# Patient Record
Sex: Male | Born: 1950 | Race: Black or African American | Hispanic: No | Marital: Married | Smoking: Never smoker
Health system: Southern US, Community
[De-identification: ages and names within clinical notes are randomized; demographics above are authoritative.]

## PROBLEM LIST (undated history)

## (undated) DIAGNOSIS — Z789 Other specified health status: Secondary | ICD-10-CM

## (undated) DIAGNOSIS — G4730 Sleep apnea, unspecified: Secondary | ICD-10-CM

## (undated) DIAGNOSIS — I10 Essential (primary) hypertension: Secondary | ICD-10-CM

## (undated) HISTORY — PX: SHOULDER ARTHROSCOPY WITH OPEN ROTATOR CUFF REPAIR: SHX6092

---

## 2003-07-06 HISTORY — PX: COLONOSCOPY: SHX174

## 2003-07-21 ENCOUNTER — Ambulatory Visit (HOSPITAL_COMMUNITY): Admission: RE | Admit: 2003-07-21 | Discharge: 2003-07-21 | Payer: Self-pay | Admitting: Internal Medicine

## 2004-07-24 ENCOUNTER — Ambulatory Visit (HOSPITAL_COMMUNITY): Admission: RE | Admit: 2004-07-24 | Discharge: 2004-07-24 | Payer: Self-pay | Admitting: Family Medicine

## 2005-11-13 ENCOUNTER — Ambulatory Visit (HOSPITAL_COMMUNITY): Admission: RE | Admit: 2005-11-13 | Discharge: 2005-11-13 | Payer: Self-pay | Admitting: Family Medicine

## 2006-05-11 ENCOUNTER — Emergency Department (HOSPITAL_COMMUNITY): Admission: EM | Admit: 2006-05-11 | Discharge: 2006-05-11 | Payer: Self-pay | Admitting: Emergency Medicine

## 2013-09-30 ENCOUNTER — Telehealth: Payer: Self-pay

## 2013-09-30 NOTE — Telephone Encounter (Signed)
Pt received a letter to set up [facility]CS. His call back number is [phone]

## 2013-09-30 NOTE — Telephone Encounter (Signed)
Pt was on RECALL for next colonoscopy. His last one was done 07/21/2003 by Dr. Gala Romney.   I returned call and could not reach pt on any of the phone numbers.   I called [phone] per Ginger and got someone's vm and lmom for a return call.

## 2013-10-01 NOTE — Telephone Encounter (Signed)
LATE ENTRY: Pt returned call yesterday afternoon. He has some hemorrhoids also, and is scheduled an OV appt with Neil Crouch, PA on 10/28/2013 at 9:00 AM.

## 2013-10-26 ENCOUNTER — Telehealth: Payer: Self-pay | Admitting: *Deleted

## 2013-10-26 NOTE — Telephone Encounter (Signed)
Pt called to schedule a colonoscopy. Please advise [phone]

## 2013-10-26 NOTE — Telephone Encounter (Signed)
Pt is aware he was already scheduled for OV on 10/28/2013 at 9:00 Am due to hemorrhoids.  He just wanted to know if he needed to prep for the OV appt. I told him just show up and when they schedule his colonoscopy he will be given his instructions.

## 2013-10-28 ENCOUNTER — Encounter: Payer: Self-pay | Admitting: Gastroenterology

## 2013-10-28 ENCOUNTER — Other Ambulatory Visit: Payer: Self-pay | Admitting: Internal Medicine

## 2013-10-28 ENCOUNTER — Encounter (INDEPENDENT_AMBULATORY_CARE_PROVIDER_SITE_OTHER): Payer: Self-pay

## 2013-10-28 ENCOUNTER — Ambulatory Visit (INDEPENDENT_AMBULATORY_CARE_PROVIDER_SITE_OTHER): Payer: Managed Care, Other (non HMO) | Admitting: Gastroenterology

## 2013-10-28 VITALS — BP 153/89 | HR 62 | Temp 98.6°F | Resp 20 | Ht 67.0 in | Wt 213.0 lb

## 2013-10-28 DIAGNOSIS — Z83719 Family history of colon polyps, unspecified: Secondary | ICD-10-CM

## 2013-10-28 DIAGNOSIS — Z8371 Family history of colonic polyps: Secondary | ICD-10-CM

## 2013-10-28 DIAGNOSIS — K649 Unspecified hemorrhoids: Secondary | ICD-10-CM

## 2013-10-28 MED ORDER — PEG 3350-KCL-NA BICARB-NACL 420 G PO SOLR: 4000.0000 mL | ORAL | Status: DC

## 2013-10-28 NOTE — Progress Notes (Signed)
cc'd to pcp 

## 2013-10-28 NOTE — Patient Instructions (Signed)
1. Colonoscopy with Dr. Rourk. Please see separate instructions. 

## 2013-10-28 NOTE — Progress Notes (Signed)
Primary Care Physician:  Leonides Grills, MD  Primary Gastroenterologist:  Garfield Cornea, MD   Chief Complaint  Patient presents with  . Hemorrhoids  . Colonoscopy    HPI:  Kevin Rose is a 63 y.o. male here to schedule colonoscopy. Last one in 2005, friable anal canal hemorrhoids. Occasionally has to use PrepH. Denies brbpr, melena, constipation, diarrhea, abdominal pain, heartburn, vomiting. Dysphagia. Weight lifter by hobby. Sometimes hemorrhoids become irritated.    No current outpatient prescriptions on file.   No current facility-administered medications for this visit.    Allergies as of 10/28/2013  . (Not on File)    History reviewed. No pertinent past medical history.  Past Surgical History  Procedure Laterality Date  . Shoulder arthroscopy with open rotator cuff repair Right   . Colonoscopy  07/2003    friable anal canal.    Family History  Problem Relation Age of Onset  . Colon polyps Mother   . Prostate cancer Father   . Lung cancer Brother   . Colon cancer Neg Hx     History   Social History  . Marital Status: Married    Spouse Name: N/A    Number of Children: 2  . Years of Education: N/A   Occupational History  . [employer]    Social History Main Topics  . Smoking status: Never Smoker   . Smokeless tobacco: Not on file  . Alcohol Use: No  . Drug Use: Not on file  . Sexual Activity: Not on file   Other Topics Concern  . Not on file   Social History Narrative  . No narrative on file      ROS:  General: Negative for anorexia, weight loss, fever, chills, fatigue, weakness. Eyes: Negative for vision changes.  ENT: Negative for hoarseness, difficulty swallowing , nasal congestion. CV: Negative for chest pain, angina, palpitations, dyspnea on exertion, peripheral edema.  Respiratory: Negative for dyspnea at rest, dyspnea on exertion, cough, sputum, wheezing.  GI: See history of present illness. GU:  Negative for dysuria,  hematuria, urinary incontinence, urinary frequency, nocturnal urination.  MS: Negative for joint pain, low back pain.  Derm: Negative for rash or itching.  Neuro: Negative for weakness, abnormal sensation, seizure, frequent headaches, memory loss, confusion.  Psych: Negative for anxiety, depression, suicidal ideation, hallucinations.  Endo: Negative for unusual weight change.  Heme: Negative for bruising or bleeding. Allergy: Negative for rash or hives.    Physical Examination:  BP 153/89  Pulse 62  Temp(Src) 98.6 F (37 C) (Oral)  Resp 20  Ht 5\' 7"  (1.702 m)  Wt 213 lb (96.616 kg)  BMI 33.35 kg/m2   General: Well-nourished, well-developed in no acute distress.  Head: Normocephalic, atraumatic.   Eyes: Conjunctiva pink, no icterus. Mouth: Oropharyngeal mucosa moist and pink , no lesions erythema or exudate. Neck: Supple without thyromegaly, masses, or lymphadenopathy.  Lungs: Clear to auscultation bilaterally.  Heart: Regular rate and rhythm, no murmurs rubs or gallops.  Abdomen: Bowel sounds are normal, nontender, nondistended, no hepatosplenomegaly or masses, no abdominal bruits or    hernia , no rebound or guarding.   Rectal: deferred Extremities: No lower extremity edema. No clubbing or deformities.  Neuro: Alert and oriented x 4 , grossly normal neurologically.  Skin: Warm and dry, no rash or jaundice.   Psych: Alert and cooperative, normal mood and affect.

## 2013-10-28 NOTE — Assessment & Plan Note (Signed)
Due for colonoscopy. Occasional irritation from hemorrhoids but no other GI complaints. Discussed CRH banding with patient today. He may be interested at a later time. Proceed with colonoscopy now.  I have discussed the risks, alternatives, benefits with regards to but not limited to the risk of reaction to medication, bleeding, infection, perforation and the patient is agreeable to proceed. Written consent to be obtained.

## 2013-10-29 ENCOUNTER — Telehealth: Payer: Self-pay | Admitting: *Deleted

## 2013-10-29 ENCOUNTER — Telehealth: Payer: Self-pay | Admitting: Internal Medicine

## 2013-10-29 NOTE — Telephone Encounter (Signed)
DONE

## 2013-10-29 NOTE — Telephone Encounter (Signed)
Pt called wanting to reschedule his colonoscopy that is scheduled for 11/24/13 with Dr. Gala Romney. Please advise [phone]

## 2013-10-29 NOTE — Telephone Encounter (Signed)
Pt was seen yesterday and needs to [facility] his procedure date. Please call [phone]

## 2013-10-29 NOTE — Telephone Encounter (Signed)
R/S Mr. Pringle to 07/16

## 2013-11-05 ENCOUNTER — Encounter (HOSPITAL_COMMUNITY): Payer: Self-pay | Admitting: Pharmacy Technician

## 2013-11-19 ENCOUNTER — Encounter (HOSPITAL_COMMUNITY)
Admission: RE | Disposition: A | Discharge status: Home / Self Care | Payer: Self-pay | Source: Ambulatory Visit | Attending: Internal Medicine

## 2013-11-19 ENCOUNTER — Ambulatory Visit (HOSPITAL_COMMUNITY)
Admission: RE | Admit: 2013-11-19 | Discharge: 2013-11-19 | Disposition: A | Discharge status: Home / Self Care | Payer: BC Managed Care – PPO | Source: Ambulatory Visit | Attending: Internal Medicine | Admitting: Internal Medicine

## 2013-11-19 ENCOUNTER — Encounter (HOSPITAL_COMMUNITY): Payer: Self-pay | Admitting: *Deleted

## 2013-11-19 DIAGNOSIS — Z1211 Encounter for screening for malignant neoplasm of colon: Secondary | ICD-10-CM

## 2013-11-19 DIAGNOSIS — Z8042 Family history of malignant neoplasm of prostate: Secondary | ICD-10-CM | POA: Diagnosis not present

## 2013-11-19 DIAGNOSIS — D126 Benign neoplasm of colon, unspecified: Secondary | ICD-10-CM | POA: Diagnosis not present

## 2013-11-19 DIAGNOSIS — K649 Unspecified hemorrhoids: Secondary | ICD-10-CM

## 2013-11-19 DIAGNOSIS — K648 Other hemorrhoids: Secondary | ICD-10-CM | POA: Insufficient documentation

## 2013-11-19 DIAGNOSIS — Z8371 Family history of colonic polyps: Secondary | ICD-10-CM | POA: Diagnosis not present

## 2013-11-19 DIAGNOSIS — F17200 Nicotine dependence, unspecified, uncomplicated: Secondary | ICD-10-CM | POA: Diagnosis not present

## 2013-11-19 DIAGNOSIS — Z83719 Family history of colon polyps, unspecified: Secondary | ICD-10-CM | POA: Insufficient documentation

## 2013-11-19 HISTORY — DX: Other specified health status: Z78.9

## 2013-11-19 HISTORY — PX: COLONOSCOPY: SHX5424

## 2013-11-19 SURGERY — COLONOSCOPY
Anesthesia: Moderate Sedation

## 2013-11-19 MED ORDER — SODIUM CHLORIDE 0.9 % IV SOLN
INTRAVENOUS | Status: DC
  Administered 2013-11-19: 11:00:00 via INTRAVENOUS

## 2013-11-19 MED ORDER — ONDANSETRON HCL 4 MG/2ML IJ SOLN
INTRAMUSCULAR | Status: AC
  Filled 2013-11-19: qty 2

## 2013-11-19 MED ORDER — MEPERIDINE HCL 100 MG/ML IJ SOLN
INTRAMUSCULAR | Status: AC
  Filled 2013-11-19: qty 2

## 2013-11-19 MED ORDER — ONDANSETRON HCL 4 MG/2ML IJ SOLN
INTRAMUSCULAR | Status: DC | PRN
  Administered 2013-11-19: 4 mg via INTRAVENOUS

## 2013-11-19 MED ORDER — MIDAZOLAM HCL 5 MG/5ML IJ SOLN
INTRAMUSCULAR | Status: DC | PRN
  Administered 2013-11-19: 1 mg via INTRAVENOUS
  Administered 2013-11-19 (×2): 2 mg via INTRAVENOUS

## 2013-11-19 MED ORDER — STERILE WATER FOR IRRIGATION IR SOLN
Status: DC | PRN
  Administered 2013-11-19: 11:00:00

## 2013-11-19 MED ORDER — MEPERIDINE HCL 100 MG/ML IJ SOLN
INTRAMUSCULAR | Status: DC | PRN
  Administered 2013-11-19: 50 mg via INTRAVENOUS
  Administered 2013-11-19: 25 mg via INTRAVENOUS

## 2013-11-19 MED ORDER — MIDAZOLAM HCL 5 MG/5ML IJ SOLN
INTRAMUSCULAR | Status: AC
  Filled 2013-11-19: qty 10

## 2013-11-19 NOTE — Op Note (Signed)
[place], [id number]   COLONOSCOPY PROCEDURE REPORT  PATIENT: Kevin Rose, Kevin Rose  MR#:         [id number] BIRTHDATE: 22-Nov-1950 , 62  yrs. old GENDER: Male ENDOSCOPIST: R.  Garfield Cornea, MD FACP FACG REFERRED BY:  Elsie Lincoln, M.D. PROCEDURE DATE:  11/19/2013 PROCEDURE:     Colonoscopy with biopsy and snare polypectomy  INDICATIONS: colorectal cancer screening examination; mother with colonic polyps  INFORMED CONSENT:  The risks, benefits, alternatives and imponderables including but not limited to bleeding, perforation as well as the possibility of a missed lesion have been reviewed.  The potential for biopsy, lesion removal, etc. have also been discussed.  Questions have been answered.  All parties agreeable. Please see the history and physical in the medical record for more information.  MEDICATIONS: Versed 5 mg IV and Demerol 75 mg IV in divided doses. Zofran 4 mg IV.  DESCRIPTION OF PROCEDURE:  After a digital rectal exam was performed, the EC-[id number] ([id number])  colonoscope was advanced from the anus through the rectum and colon to the area of the cecum, ileocecal valve and appendiceal orifice.  The cecum was deeply intubated.  These structures were well-seen and photographed for the record.  From the level of the cecum and ileocecal valve, the scope was slowly and cautiously withdrawn.  The mucosal surfaces were carefully surveyed utilizing scope tip deflection to facilitate fold flattening as needed.  The scope was pulled down into the rectum where a thorough examination including retroflexion was performed.    FINDINGS:  Adequate preparation. Anal canal hemorrhoids; otherwise, normal rectum. (1) 4 mm pedunculated polyp in the mid descending segment; (2) diminutive polyps adjacent to the pedunculated polyp; otherwise, the remainder of colonic mucosa appeared normal.  THERAPEUTIC / DIAGNOSTIC MANEUVERS PERFORMED:  The  above-mentioned polyps were cold snare and cold biopsy removed , respectively.  COMPLICATIONS: none  CECAL WITHDRAWAL TIME:  14 minutes  IMPRESSION:  Multiple colonic polyps-removed as described above  RECOMMENDATIONS: Followup on pathology.   _______________________________ eSigned:  R. Garfield Cornea, MD FACP [facility] 11/19/2013 11:47 AM   CC:

## 2013-11-19 NOTE — Discharge Instructions (Addendum)
Colonoscopy Discharge Instructions  Read the instructions outlined below and refer to this sheet in the next few weeks. These discharge instructions provide you with general information on caring for yourself after you leave the hospital. Your doctor may also give you specific instructions. While your treatment has been planned according to the most current medical practices available, unavoidable complications occasionally occur. If you have any problems or questions after discharge, call Dr. Gala Romney at [phone]. ACTIVITY  You may resume your regular activity, but move at a slower pace for the next 24 hours.   Take frequent rest periods for the next 24 hours.   Walking will help get rid of the air and reduce the bloated feeling in your belly (abdomen).   No driving for 24 hours (because of the medicine (anesthesia) used during the test).    Do not sign any important legal documents or operate any machinery for 24 hours (because of the anesthesia used during the test).  NUTRITION  Drink plenty of fluids.   You may resume your normal diet as instructed by your doctor.   Begin with a light meal and progress to your normal diet. Heavy or fried foods are harder to digest and may make you feel sick to your stomach (nauseated).   Avoid alcoholic beverages for 24 hours or as instructed.  MEDICATIONS  You may resume your normal medications unless your doctor tells you otherwise.  WHAT YOU CAN EXPECT TODAY  Some feelings of bloating in the abdomen.   Passage of more gas than usual.   Spotting of blood in your stool or on the toilet paper.  IF YOU HAD POLYPS REMOVED DURING THE COLONOSCOPY:  No aspirin products for 7 days or as instructed.   No alcohol for 7 days or as instructed.   Eat a soft diet for the next 24 hours.  FINDING OUT THE RESULTS OF YOUR TEST Not all test results are available during your visit. If your test results are not back during the visit, make an appointment  with your caregiver to find out the results. Do not assume everything is normal if you have not heard from your caregiver or the medical facility. It is important for you to follow up on all of your test results.  SEEK IMMEDIATE MEDICAL ATTENTION IF:  You have more than a spotting of blood in your stool.   Your belly is swollen (abdominal distention).   You are nauseated or vomiting.   You have a temperature over 101.   You have abdominal pain or discomfort that is severe or gets worse throughout the day.    Polyp information provided  Further recommendations to follow pending review of pathology report  Colonoscopy, Care After Refer to this sheet in the next few weeks. These instructions provide you with information on caring for yourself after your procedure. Your health care provider may also give you more specific instructions. Your treatment has been planned according to current medical practices, but problems sometimes occur. Call your health care provider if you have any problems or questions after your procedure. WHAT TO EXPECT AFTER THE PROCEDURE  After your procedure, it is typical to have the following:  A small amount of blood in your stool.  Moderate amounts of gas and mild abdominal cramping or bloating. HOME CARE INSTRUCTIONS  Do not drive, operate machinery, or sign important documents for 24 hours.  You may shower and resume your regular physical activities, but move at a slower pace for the  first 24 hours.  Take frequent rest periods for the first 24 hours.  Walk around or put a warm pack on your abdomen to help reduce abdominal cramping and bloating.  Drink enough fluids to keep your urine clear or pale yellow.  You may resume your normal diet as instructed by your health care provider. Avoid heavy or fried foods that are hard to digest.  Avoid drinking alcohol for 24 hours or as instructed by your health care provider.  Only take over-the-counter or  prescription medicines as directed by your health care provider.  If a tissue sample (biopsy) was taken during your procedure:  Do not take aspirin or blood thinners for 7 days, or as instructed by your health care provider.  Do not drink alcohol for 7 days, or as instructed by your health care provider.  Eat soft foods for the first 24 hours. SEEK MEDICAL CARE IF: You have persistent spotting of blood in your stool 2-3 days after the procedure. SEEK IMMEDIATE MEDICAL CARE IF:  You have more than a small spotting of blood in your stool.  You pass large blood clots in your stool.  Your abdomen is swollen (distended).  You have nausea or vomiting.  You have a fever.  You have increasing abdominal pain that is not relieved with medicine. Document Released: 12/06/2003 Document Revised: 02/11/2013 Document Reviewed: 12/29/2012 [facility] Patient Information 2015 [place], [place]. This information is not intended to replace advice given to you by your health care provider. Make sure you discuss any questions you have with your health care provider. Colon Polyps Polyps are lumps of extra tissue growing inside the body. Polyps can grow in the large intestine (colon). Most colon polyps are noncancerous (benign). However, some colon polyps can become cancerous over time. Polyps that are larger than a pea may be harmful. To be safe, caregivers remove and test all polyps. CAUSES  Polyps form when mutations in the genes cause your cells to grow and divide even though no more tissue is needed. RISK FACTORS There are a number of risk factors that can increase your chances of getting colon polyps. They include:  Being older than 50 years.  Family history of colon polyps or colon cancer.  Long-term colon diseases, such as colitis or Crohn disease.  Being overweight.  Smoking.  Being inactive.  Drinking too much alcohol. SYMPTOMS  Most small polyps do not cause symptoms. If symptoms are  present, they may include:  Blood in the stool. The stool may look dark red or black.  Constipation or diarrhea that lasts longer than 1 week. DIAGNOSIS People often do not know they have polyps until their caregiver finds them during a regular checkup. Your caregiver can use 4 tests to check for polyps:  Digital rectal exam. The caregiver wears gloves and feels inside the rectum. This test would find polyps only in the rectum.  Barium enema. The caregiver puts a liquid called barium into your rectum before taking X-rays of your colon. Barium makes your colon look white. Polyps are dark, so they are easy to see in the X-ray pictures.  Sigmoidoscopy. A thin, flexible tube (sigmoidoscope) is placed into your rectum. The sigmoidoscope has a light and tiny camera in it. The caregiver uses the sigmoidoscope to look at the last third of your colon.  Colonoscopy. This test is like sigmoidoscopy, but the caregiver looks at the entire colon. This is the most common method for finding and removing polyps. TREATMENT  Any polyps will  be removed during a sigmoidoscopy or colonoscopy. The polyps are then tested for cancer. PREVENTION  To help lower your risk of getting more colon polyps:  Eat plenty of fruits and vegetables. Avoid eating fatty foods.  Do not smoke.  Avoid drinking alcohol.  Exercise every day.  Lose weight if recommended by your caregiver.  Eat plenty of calcium and folate. Foods that are rich in calcium include milk, cheese, and broccoli. Foods that are rich in folate include chickpeas, kidney beans, and spinach. HOME CARE INSTRUCTIONS Keep all follow-up appointments as directed by your caregiver. You may need periodic exams to check for polyps. SEEK MEDICAL CARE IF: You notice bleeding during a bowel movement. Document Released: 01/18/2004 Document Revised: 07/16/2011 Document Reviewed: 07/03/2011 [facility] Patient Information 2015 [place], [place]. This information is not  intended to replace advice given to you by your health care provider. Make sure you discuss any questions you have with your health care provider.

## 2013-11-19 NOTE — H&P (View-Only) (Signed)
Primary Care Physician:  Leonides Grills, MD  Primary Gastroenterologist:  Garfield Cornea, MD   Chief Complaint  Patient presents with  . Hemorrhoids  . Colonoscopy    HPI:  Kevin Rose is a 63 y.o. male here to schedule colonoscopy. Last one in 2005, friable anal canal hemorrhoids. Occasionally has to use PrepH. Denies brbpr, melena, constipation, diarrhea, abdominal pain, heartburn, vomiting. Dysphagia. Weight lifter by hobby. Sometimes hemorrhoids become irritated.    No current outpatient prescriptions on file.   No current facility-administered medications for this visit.    Allergies as of 10/28/2013  . (Not on File)    History reviewed. No pertinent past medical history.  Past Surgical History  Procedure Laterality Date  . Shoulder arthroscopy with open rotator cuff repair Right   . Colonoscopy  07/2003    friable anal canal.    Family History  Problem Relation Age of Onset  . Colon polyps Mother   . Prostate cancer Father   . Lung cancer Brother   . Colon cancer Neg Hx     History   Social History  . Marital Status: Married    Spouse Name: N/A    Number of Children: 2  . Years of Education: N/A   Occupational History  . [employer]    Social History Main Topics  . Smoking status: Never Smoker   . Smokeless tobacco: Not on file  . Alcohol Use: No  . Drug Use: Not on file  . Sexual Activity: Not on file   Other Topics Concern  . Not on file   Social History Narrative  . No narrative on file      ROS:  General: Negative for anorexia, weight loss, fever, chills, fatigue, weakness. Eyes: Negative for vision changes.  ENT: Negative for hoarseness, difficulty swallowing , nasal congestion. CV: Negative for chest pain, angina, palpitations, dyspnea on exertion, peripheral edema.  Respiratory: Negative for dyspnea at rest, dyspnea on exertion, cough, sputum, wheezing.  GI: See history of present illness. GU:  Negative for dysuria,  hematuria, urinary incontinence, urinary frequency, nocturnal urination.  MS: Negative for joint pain, low back pain.  Derm: Negative for rash or itching.  Neuro: Negative for weakness, abnormal sensation, seizure, frequent headaches, memory loss, confusion.  Psych: Negative for anxiety, depression, suicidal ideation, hallucinations.  Endo: Negative for unusual weight change.  Heme: Negative for bruising or bleeding. Allergy: Negative for rash or hives.    Physical Examination:  BP 153/89  Pulse 62  Temp(Src) 98.6 F (37 C) (Oral)  Resp 20  Ht 5\' 7"  (1.702 m)  Wt 213 lb (96.616 kg)  BMI 33.35 kg/m2   General: Well-nourished, well-developed in no acute distress.  Head: Normocephalic, atraumatic.   Eyes: Conjunctiva pink, no icterus. Mouth: Oropharyngeal mucosa moist and pink , no lesions erythema or exudate. Neck: Supple without thyromegaly, masses, or lymphadenopathy.  Lungs: Clear to auscultation bilaterally.  Heart: Regular rate and rhythm, no murmurs rubs or gallops.  Abdomen: Bowel sounds are normal, nontender, nondistended, no hepatosplenomegaly or masses, no abdominal bruits or    hernia , no rebound or guarding.   Rectal: deferred Extremities: No lower extremity edema. No clubbing or deformities.  Neuro: Alert and oriented x 4 , grossly normal neurologically.  Skin: Warm and dry, no rash or jaundice.   Psych: Alert and cooperative, normal mood and affect.

## 2013-11-19 NOTE — Interval H&P Note (Signed)
History and Physical Interval Note:  11/19/2013 11:07 AM  Kevin Rose  has presented today for surgery, with the diagnosis of HEMORRHOIDS AND FAMILY HISTORY OF COLON POLYPS  The various methods of treatment have been discussed with the patient and family. After consideration of risks, benefits and other options for treatment, the patient has consented to  Procedure(s) with comments: COLONOSCOPY (N/A) - 10:30-rescheduled to [place] notified pt as a surgical intervention .  The patient's history has been reviewed, patient examined, no change in status, stable for surgery.  I have reviewed the patient's chart and labs.  Questions were answered to the patient's satisfaction.     [place] Rose  No Change. Colonoscopy per plan.  The risks, benefits, limitations, alternatives and imponderables have been reviewed with the patient. Questions have been answered. All parties are agreeable.

## 2013-11-21 ENCOUNTER — Encounter: Payer: Self-pay | Admitting: Internal Medicine

## 2013-11-25 ENCOUNTER — Encounter (HOSPITAL_COMMUNITY): Payer: Self-pay | Admitting: Internal Medicine

## 2016-02-17 DIAGNOSIS — Z1389 Encounter for screening for other disorder: Secondary | ICD-10-CM | POA: Diagnosis not present

## 2016-02-17 DIAGNOSIS — R42 Dizziness and giddiness: Secondary | ICD-10-CM | POA: Diagnosis not present

## 2016-02-17 DIAGNOSIS — H6123 Impacted cerumen, bilateral: Secondary | ICD-10-CM | POA: Diagnosis not present

## 2016-02-17 DIAGNOSIS — Z6833 Body mass index (BMI) 33.0-33.9, adult: Secondary | ICD-10-CM | POA: Diagnosis not present

## 2016-03-05 DIAGNOSIS — Z23 Encounter for immunization: Secondary | ICD-10-CM | POA: Diagnosis not present

## 2017-09-03 ENCOUNTER — Ambulatory Visit: Payer: BC Managed Care – PPO | Admitting: Urology

## 2017-09-03 DIAGNOSIS — N5201 Erectile dysfunction due to arterial insufficiency: Secondary | ICD-10-CM

## 2017-09-03 DIAGNOSIS — R972 Elevated prostate specific antigen [PSA]: Secondary | ICD-10-CM

## 2018-02-18 ENCOUNTER — Ambulatory Visit: Payer: Medicare Other | Admitting: Urology

## 2018-02-18 DIAGNOSIS — R972 Elevated prostate specific antigen [PSA]: Secondary | ICD-10-CM

## 2019-02-10 ENCOUNTER — Ambulatory Visit (INDEPENDENT_AMBULATORY_CARE_PROVIDER_SITE_OTHER): Payer: Medicare Other | Admitting: Urology

## 2019-02-10 DIAGNOSIS — N5201 Erectile dysfunction due to arterial insufficiency: Secondary | ICD-10-CM | POA: Diagnosis not present

## 2019-02-10 DIAGNOSIS — R972 Elevated prostate specific antigen [PSA]: Secondary | ICD-10-CM | POA: Diagnosis not present

## 2019-02-17 ENCOUNTER — Ambulatory Visit: Payer: Medicare Other | Admitting: Urology

## 2019-05-21 ENCOUNTER — Other Ambulatory Visit: Payer: Self-pay

## 2019-05-21 DIAGNOSIS — R972 Elevated prostate specific antigen [PSA]: Secondary | ICD-10-CM

## 2019-06-08 DIAGNOSIS — H401132 Primary open-angle glaucoma, bilateral, moderate stage: Secondary | ICD-10-CM | POA: Diagnosis not present

## 2019-07-01 DIAGNOSIS — E7849 Other hyperlipidemia: Secondary | ICD-10-CM | POA: Diagnosis not present

## 2019-07-01 DIAGNOSIS — Z1389 Encounter for screening for other disorder: Secondary | ICD-10-CM | POA: Diagnosis not present

## 2019-07-01 DIAGNOSIS — Z0000 Encounter for general adult medical examination without abnormal findings: Secondary | ICD-10-CM | POA: Diagnosis not present

## 2019-07-01 DIAGNOSIS — E6609 Other obesity due to excess calories: Secondary | ICD-10-CM | POA: Diagnosis not present

## 2019-07-01 DIAGNOSIS — Z6834 Body mass index (BMI) 34.0-34.9, adult: Secondary | ICD-10-CM | POA: Diagnosis not present

## 2019-07-01 DIAGNOSIS — Z0001 Encounter for general adult medical examination with abnormal findings: Secondary | ICD-10-CM | POA: Diagnosis not present

## 2019-07-03 ENCOUNTER — Ambulatory Visit: Payer: Medicare PPO | Attending: Internal Medicine

## 2019-07-03 DIAGNOSIS — Z23 Encounter for immunization: Secondary | ICD-10-CM

## 2019-07-03 NOTE — Progress Notes (Signed)
   Covid-19 Vaccination Clinic  Name:  Kevin Rose    MRN: [id number] DOB: 1950/11/07  07/03/2019  [name] was observed post Covid-19 immunization for 15 minutes without incidence. He was provided with Vaccine Information Sheet and instruction to access the V-Safe system.   [name] was instructed to call 911 with any severe reactions post vaccine: Marland Kitchen Difficulty breathing  . Swelling of your face and throat  . A fast heartbeat  . A bad rash all over your body  . Dizziness and weakness    Immunizations Administered    Name Date Dose VIS Date Route   Pfizer COVID-19 Vaccine 07/03/2019 12:50 PM 0.3 mL 04/17/2019 Intramuscular   Manufacturer: [place]   Lot: [id number]   [place]: [id number]

## 2019-07-28 ENCOUNTER — Ambulatory Visit: Payer: Medicare PPO | Attending: Internal Medicine

## 2019-07-28 ENCOUNTER — Ambulatory Visit: Payer: Medicare PPO

## 2019-07-28 DIAGNOSIS — Z23 Encounter for immunization: Secondary | ICD-10-CM

## 2019-07-28 NOTE — Progress Notes (Signed)
   Covid-19 Vaccination Clinic  Name:  Kevin Rose    MRN: [id number] DOB: 1951/01/09  07/28/2019  [name] was observed post Covid-19 immunization for 15 minutes without incident. He was provided with Vaccine Information Sheet and instruction to access the V-Safe system.   [name] was instructed to call 911 with any severe reactions post vaccine: Marland Kitchen Difficulty breathing  . Swelling of face and throat  . A fast heartbeat  . A bad rash all over body  . Dizziness and weakness   Immunizations Administered    Name Date Dose VIS Date Route   Pfizer COVID-19 Vaccine 07/28/2019  3:37 PM 0.3 mL 04/17/2019 Intramuscular   Manufacturer: [place]   Lot: [id number]   [place]: [id number]

## 2019-09-01 DIAGNOSIS — H25813 Combined forms of age-related cataract, bilateral: Secondary | ICD-10-CM | POA: Diagnosis not present

## 2019-09-01 DIAGNOSIS — H524 Presbyopia: Secondary | ICD-10-CM | POA: Diagnosis not present

## 2019-09-01 DIAGNOSIS — H5213 Myopia, bilateral: Secondary | ICD-10-CM | POA: Diagnosis not present

## 2019-09-01 DIAGNOSIS — H401132 Primary open-angle glaucoma, bilateral, moderate stage: Secondary | ICD-10-CM | POA: Diagnosis not present

## 2019-09-09 ENCOUNTER — Other Ambulatory Visit (HOSPITAL_COMMUNITY): Payer: Self-pay | Admitting: Nephrology

## 2019-09-09 ENCOUNTER — Other Ambulatory Visit: Payer: Self-pay | Admitting: Nephrology

## 2019-09-09 DIAGNOSIS — I272 Pulmonary hypertension, unspecified: Secondary | ICD-10-CM

## 2019-09-09 DIAGNOSIS — N1832 Chronic kidney disease, stage 3b: Secondary | ICD-10-CM

## 2019-09-09 DIAGNOSIS — I10 Essential (primary) hypertension: Secondary | ICD-10-CM

## 2019-09-09 DIAGNOSIS — Z79899 Other long term (current) drug therapy: Secondary | ICD-10-CM | POA: Diagnosis not present

## 2019-09-09 DIAGNOSIS — I129 Hypertensive chronic kidney disease with stage 1 through stage 4 chronic kidney disease, or unspecified chronic kidney disease: Secondary | ICD-10-CM | POA: Diagnosis not present

## 2019-09-15 DIAGNOSIS — D631 Anemia in chronic kidney disease: Secondary | ICD-10-CM | POA: Diagnosis not present

## 2019-09-15 DIAGNOSIS — I129 Hypertensive chronic kidney disease with stage 1 through stage 4 chronic kidney disease, or unspecified chronic kidney disease: Secondary | ICD-10-CM | POA: Diagnosis not present

## 2019-09-15 DIAGNOSIS — N1832 Chronic kidney disease, stage 3b: Secondary | ICD-10-CM | POA: Diagnosis not present

## 2019-09-15 DIAGNOSIS — I272 Pulmonary hypertension, unspecified: Secondary | ICD-10-CM | POA: Diagnosis not present

## 2019-09-15 DIAGNOSIS — Z79899 Other long term (current) drug therapy: Secondary | ICD-10-CM | POA: Diagnosis not present

## 2019-09-16 ENCOUNTER — Other Ambulatory Visit: Payer: Self-pay

## 2019-09-16 ENCOUNTER — Ambulatory Visit (HOSPITAL_COMMUNITY)
Admission: RE | Admit: 2019-09-16 | Discharge: 2019-09-16 | Disposition: A | Discharge status: Home / Self Care | Payer: Medicare PPO | Source: Ambulatory Visit | Attending: Nephrology | Admitting: Nephrology

## 2019-09-16 DIAGNOSIS — N1832 Chronic kidney disease, stage 3b: Secondary | ICD-10-CM | POA: Insufficient documentation

## 2019-09-16 DIAGNOSIS — I10 Essential (primary) hypertension: Secondary | ICD-10-CM | POA: Insufficient documentation

## 2019-09-16 DIAGNOSIS — N183 Chronic kidney disease, stage 3 unspecified: Secondary | ICD-10-CM | POA: Diagnosis not present

## 2019-09-16 DIAGNOSIS — Z79899 Other long term (current) drug therapy: Secondary | ICD-10-CM | POA: Diagnosis not present

## 2019-09-21 ENCOUNTER — Other Ambulatory Visit: Payer: Self-pay

## 2019-09-21 ENCOUNTER — Ambulatory Visit (HOSPITAL_COMMUNITY)
Admission: RE | Admit: 2019-09-21 | Discharge: 2019-09-21 | Disposition: A | Discharge status: Home / Self Care | Payer: Medicare PPO | Source: Ambulatory Visit | Attending: Nephrology | Admitting: Nephrology

## 2019-09-21 DIAGNOSIS — I10 Essential (primary) hypertension: Secondary | ICD-10-CM | POA: Diagnosis not present

## 2019-09-21 DIAGNOSIS — I272 Pulmonary hypertension, unspecified: Secondary | ICD-10-CM | POA: Diagnosis not present

## 2019-09-21 DIAGNOSIS — I351 Nonrheumatic aortic (valve) insufficiency: Secondary | ICD-10-CM | POA: Diagnosis not present

## 2019-09-21 NOTE — Progress Notes (Signed)
  Echocardiogram 2D Echocardiogram has been performed.  Johny Chess 09/21/2019, 1:51 PM

## 2019-10-07 DIAGNOSIS — N1831 Chronic kidney disease, stage 3a: Secondary | ICD-10-CM | POA: Diagnosis not present

## 2019-10-07 DIAGNOSIS — N270 Small kidney, unilateral: Secondary | ICD-10-CM | POA: Diagnosis not present

## 2019-10-07 DIAGNOSIS — D841 Defects in the complement system: Secondary | ICD-10-CM | POA: Diagnosis not present

## 2019-10-07 DIAGNOSIS — I129 Hypertensive chronic kidney disease with stage 1 through stage 4 chronic kidney disease, or unspecified chronic kidney disease: Secondary | ICD-10-CM | POA: Diagnosis not present

## 2019-12-14 ENCOUNTER — Emergency Department (HOSPITAL_COMMUNITY)
Admission: EM | Admit: 2019-12-14 | Discharge: 2019-12-15 | Disposition: A | Discharge status: Home / Self Care | Payer: Medicare PPO | Attending: Emergency Medicine | Admitting: Emergency Medicine

## 2019-12-14 ENCOUNTER — Other Ambulatory Visit: Payer: Self-pay

## 2019-12-14 ENCOUNTER — Emergency Department (HOSPITAL_COMMUNITY): Payer: Medicare PPO

## 2019-12-14 ENCOUNTER — Encounter (HOSPITAL_COMMUNITY): Payer: Self-pay

## 2019-12-14 DIAGNOSIS — Y9289 Other specified places as the place of occurrence of the external cause: Secondary | ICD-10-CM | POA: Insufficient documentation

## 2019-12-14 DIAGNOSIS — R0789 Other chest pain: Secondary | ICD-10-CM | POA: Diagnosis not present

## 2019-12-14 DIAGNOSIS — R21 Rash and other nonspecific skin eruption: Secondary | ICD-10-CM | POA: Diagnosis not present

## 2019-12-14 DIAGNOSIS — W228XXA Striking against or struck by other objects, initial encounter: Secondary | ICD-10-CM | POA: Insufficient documentation

## 2019-12-14 DIAGNOSIS — R220 Localized swelling, mass and lump, head: Secondary | ICD-10-CM | POA: Diagnosis not present

## 2019-12-14 DIAGNOSIS — S0990XA Unspecified injury of head, initial encounter: Secondary | ICD-10-CM | POA: Insufficient documentation

## 2019-12-14 DIAGNOSIS — S060X1A Concussion with loss of consciousness of 30 minutes or less, initial encounter: Secondary | ICD-10-CM | POA: Insufficient documentation

## 2019-12-14 DIAGNOSIS — S20212A Contusion of left front wall of thorax, initial encounter: Secondary | ICD-10-CM | POA: Diagnosis not present

## 2019-12-14 DIAGNOSIS — S20219A Contusion of unspecified front wall of thorax, initial encounter: Secondary | ICD-10-CM | POA: Diagnosis not present

## 2019-12-14 DIAGNOSIS — S01511A Laceration without foreign body of lip, initial encounter: Secondary | ICD-10-CM | POA: Insufficient documentation

## 2019-12-14 DIAGNOSIS — S199XXA Unspecified injury of neck, initial encounter: Secondary | ICD-10-CM | POA: Diagnosis not present

## 2019-12-14 DIAGNOSIS — Y999 Unspecified external cause status: Secondary | ICD-10-CM | POA: Diagnosis not present

## 2019-12-14 DIAGNOSIS — Y9364 Activity, baseball: Secondary | ICD-10-CM | POA: Diagnosis not present

## 2019-12-14 DIAGNOSIS — R079 Chest pain, unspecified: Secondary | ICD-10-CM | POA: Diagnosis not present

## 2019-12-14 DIAGNOSIS — S0993XA Unspecified injury of face, initial encounter: Secondary | ICD-10-CM | POA: Diagnosis not present

## 2019-12-14 DIAGNOSIS — I10 Essential (primary) hypertension: Secondary | ICD-10-CM | POA: Diagnosis not present

## 2019-12-14 DIAGNOSIS — R410 Disorientation, unspecified: Secondary | ICD-10-CM | POA: Diagnosis not present

## 2019-12-14 DIAGNOSIS — R4020 Unspecified coma: Secondary | ICD-10-CM | POA: Diagnosis not present

## 2019-12-14 LAB — COMPREHENSIVE METABOLIC PANEL
ALT: 23 U/L (ref 0–44)
AST: 32 U/L (ref 15–41)
Albumin: 4.4 g/dL (ref 3.5–5.0)
Alkaline Phosphatase: 65 U/L (ref 38–126)
Anion gap: 8 (ref 5–15)
BUN: 16 mg/dL (ref 8–23)
CO2: 28 mmol/L (ref 22–32)
Calcium: 9.7 mg/dL (ref 8.9–10.3)
Chloride: 102 mmol/L (ref 98–111)
Creatinine, Ser: 1.5 mg/dL — ABNORMAL HIGH (ref 0.61–1.24)
GFR calc Af Amer: 54 mL/min — ABNORMAL LOW (ref >60)
GFR calc non Af Amer: 47 mL/min — ABNORMAL LOW (ref >60)
Glucose, Bld: 117 mg/dL — ABNORMAL HIGH (ref 70–99)
Potassium: 4.6 mmol/L (ref 3.5–5.1)
Sodium: 138 mmol/L (ref 135–145)
Total Bilirubin: 0.7 mg/dL (ref 0.3–1.2)
Total Protein: 7.6 g/dL (ref 6.5–8.1)

## 2019-12-14 LAB — PROTIME-INR
INR: 1.1 (ref 0.8–1.2)
Prothrombin Time: 13.3 seconds (ref 11.4–15.2)

## 2019-12-14 LAB — TROPONIN I (HIGH SENSITIVITY): Troponin I (High Sensitivity): 15 ng/L (ref <18)

## 2019-12-14 MED ORDER — CHLORHEXIDINE GLUCONATE 0.12 % MT SOLN: 15.0000 mL | Freq: Two times a day (BID) | OROMUCOSAL | 0 refills | Status: DC

## 2019-12-14 MED ORDER — KETOROLAC TROMETHAMINE 15 MG/ML IJ SOLN
15.0000 mg | Freq: Once | INTRAMUSCULAR | Status: AC
  Administered 2019-12-14: 15 mg via INTRAVENOUS
  Filled 2019-12-14: qty 1

## 2019-12-14 MED ORDER — ACETAMINOPHEN 500 MG PO TABS
1000.0000 mg | ORAL_TABLET | Freq: Once | ORAL | Status: AC
  Administered 2019-12-14: 1000 mg via ORAL
  Filled 2019-12-14: qty 2

## 2019-12-14 NOTE — Discharge Instructions (Signed)
1.  See your doctor for recheck within the next 2 to 3 days. 2.  Review instructions for concussion.  If you are having worsening or changing symptoms, follow-up with your doctor.  You may need referral to a neurologist or headache specialist if you have persisting symptoms. 3.  Your kidney function is mildly decreased.  It appears this has been present and is being observed by your doctor.  You should avoid use of nonsteroidal anti-inflammatories (Aleve, naproxen, ibuprofen, Motrin) except for very occasional pain control.  Use acetaminophen (Tylenol) for pain control. 4.  You have a cut to the inside of your upper lip, use chlorhexidine solution swish twice a day as prescribed.  Can appointment to see your dentist as soon as possible.  You have a tooth that does not feel loose but is sore, the root may be damaged and need immediate attention by dentist. 5.  Return to the emergency department if you experience worsening headache, confusion, problems with your vision, worsening chest pain, difficulty breathing, feeling like you will pass out or other concerning symptoms.

## 2019-12-14 NOTE — ED Provider Notes (Signed)
[facility] EMERGENCY DEPARTMENT Provider Note   CSN: [id number] Arrival date & time: 12/14/19  2023     History Chief Complaint  Patient presents with  . Head Injury    and left sided underarm pain  . Altered Mental Status    GEN CLAGG is a 69 y.o. male.  HPI Patient was playing baseball.  He collided with another player and got struck in the head.  Reportedly, patient was unconscious for couple minutes.  Patient cannot recall exactly how the collision occurred.  He reports he does have a mild pressure headache.  He denies feeling nauseated or vomiting.  He reports he has pain at the left side of his chest.  It is made worse by certain movements and deep breath.  He denies numbness or tingling to his arms or his legs.  No extremity dysfunction or deformity.  Patient does not take any anticoagulant medications.  He reports he is feeling well before this incident.  He reports he does take anti-inflammatory medications before going out to play sports but otherwise is active with good health.  He reports he has diet controlled borderline hypertension is not on medications.    Past Medical History:  Diagnosis Date  . Medical history non-contributory     Patient Active Problem List   Diagnosis Date Noted  . Hemorrhoids 10/28/2013  . Family history of colonic polyps 10/28/2013    Past Surgical History:  Procedure Laterality Date  . COLONOSCOPY  07/2003   friable anal canal.  . COLONOSCOPY N/A 11/19/2013   Procedure: COLONOSCOPY;  Surgeon: Daneil Dolin, MD;  Location: [facility] SUITE;  Service: Endoscopy;  Laterality: N/A;  10:30-rescheduled to [place] notified pt  . SHOULDER ARTHROSCOPY WITH OPEN ROTATOR CUFF REPAIR Right        Family History  Problem Relation Age of Onset  . Colon polyps Mother   . Prostate cancer Father   . Lung cancer Brother   . Colon cancer Neg Hx     Social History   Tobacco Use  . Smoking status: Never Smoker  .  Smokeless tobacco: Never Used  Substance Use Topics  . Alcohol use: No  . Drug use: No    Home Medications Prior to Admission medications   Medication Sig Start Date End Date Taking? Authorizing Provider  Cholecalciferol (VITAMIN D3 PO) Take 1 tablet by mouth daily.   Yes [provider]  latanoprost (XALATAN) 0.005 % ophthalmic solution Place 1 drop into both eyes at bedtime. 09/01/19  Yes [provider]  Multiple Vitamin (MULTIVITAMIN WITH MINERALS) TABS tablet Take 1 tablet by mouth daily.   Yes [provider]  naproxen sodium (ALEVE) 220 MG tablet Take 220-440 mg by mouth 2 (two) times daily as needed (pain).   Yes [provider]  sildenafil (REVATIO) 20 MG tablet Take 20-100 mg by mouth daily as needed (erectile dysfunction).  11/13/19  Yes [provider]  chlorhexidine (PERIDEX) 0.12 % solution Use as directed 15 mLs in the mouth or throat 2 (two) times daily. 12/14/19   Charlesetta Shanks, MD    Allergies    Patient has no known allergies.  Review of Systems   Review of Systems 10 systems reviewed and negative except as per HPI Physical Exam Updated Vital Signs BP (!) 148/75   Pulse 60   Temp 98.5 F (36.9 C) (Oral)   Resp 18   Ht 5\' 6"  (1.676 m)   Wt 83.9  kg   SpO2 98%   BMI 29.86 kg/m   Physical Exam Constitutional:      Comments: Patient is alert but mildly uncomfortable in appearance.  GCS of 15.  HENT:     Head:     Comments: Patient has some dried blood in the naris and contusion to the right upper lip.    Nose:     Comments: Both nares are patent.  No septal hematoma.  Dried blood in both nares.    Mouth/Throat:     Comments: Contusion to the right upper lip.  A approximately 3 mm laceration to the mucosal surface on the inner aspect.  This is not through and through.  No active bleeding.  Patient has some tenderness to palpation of the first right upper incisor.  This does not feel loose.  Other teeth  nontender. Eyes:     Extraocular Movements: Extraocular movements intact.     Conjunctiva/sclera: Conjunctivae normal.     Pupils: Pupils are equal, round, and reactive to light.  Neck:     Comments: Patient endorses mild discomfort to palpation along the right paraspinous elements. Cardiovascular:     Rate and Rhythm: Normal rate and regular rhythm.  Pulmonary:     Effort: Pulmonary effort is normal.     Breath sounds: Normal breath sounds.     Comments: Some pain with deep inspiration.  Pain to palpation left mid sternal border and left ribs anterolaterally from about 5-7.  No crepitus.  No visible chest wall abrasions or contusions. Chest:     Chest wall: Tenderness present.  Abdominal:     General: There is no distension.     Palpations: Abdomen is soft.     Tenderness: There is no abdominal tenderness. There is no guarding.  Musculoskeletal:        General: No swelling or tenderness. Normal range of motion.     Right lower leg: No edema.     Left lower leg: No edema.  Skin:    General: Skin is warm and dry.  Neurological:     General: No focal deficit present.     Mental Status: He is oriented to person, place, and time.     Cranial Nerves: No cranial nerve deficit.     Coordination: Coordination normal.  Psychiatric:        Mood and Affect: Mood normal.     ED Results / Procedures / Treatments   Labs (all labs ordered are listed, but only abnormal results are displayed) Labs Reviewed  COMPREHENSIVE METABOLIC PANEL - Abnormal; Notable for the following components:      Result Value   Glucose, Bld 117 (*)    Creatinine, Ser 1.50 (*)    GFR calc non Af Amer 47 (*)    GFR calc Af Amer 54 (*)    All other components within normal limits  PROTIME-INR  CBC WITH DIFFERENTIAL/PLATELET  URINALYSIS, ROUTINE W REFLEX MICROSCOPIC  TROPONIN I (HIGH SENSITIVITY)  TROPONIN I (HIGH SENSITIVITY)    EKG EKG Interpretation  Date/Time:  Monday December 14 2019 20:24:02  EDT Ventricular Rate:  64 PR Interval:    QRS Duration: 88 QT Interval:  381 QTC Calculation: 393 R Axis:   87 Text Interpretation: Sinus rhythm Right atrial enlargement Borderline right axis deviation Probable LVH with secondary repol abnrm agree, no old comparison Confirmed by Charlesetta Shanks [phone]) on 12/14/2019 10:12:19 PM   Radiology CT Head Wo Contrast  Result Date: 12/14/2019 CLINICAL DATA:  Collided with another baseball player EXAM: CT HEAD WITHOUT CONTRAST TECHNIQUE: Contiguous axial images were obtained from the base of the skull through the vertex without intravenous contrast. COMPARISON:  None. FINDINGS: Brain: No evidence of acute territorial infarction, hemorrhage, hydrocephalus,extra-axial collection or mass lesion/mass effect. Normal gray-white differentiation. Ventricles are normal in size and contour. Vascular: No hyperdense vessel or unexpected calcification. Skull: The skull is intact. No fracture or focal lesion identified. Sinuses/Orbits: The visualized paranasal sinuses and mastoid air cells are clear. The orbits and globes intact. Other: None Face: Osseous: No acute fracture or other significant osseous abnormality.The nasal bone, mandibles, zygomatic arches and pterygoid plates are intact. Orbits: No fracture identified. Unremarkable appearance of globes and orbits. Sinuses: The visualized paranasal sinuses and mastoid air cells are unremarkable. Soft tissues: There is right-sided periorbital and upper maxillary soft tissue swelling. Limited intracranial: No acute findings. Cervical spine: Alignment: There is straightening of the normal cervical lordosis. Skull base and vertebrae: Visualized skull base is intact. No atlanto-occipital dissociation. The vertebral body heights are well maintained. No fracture or pathologic osseous lesion seen. Soft tissues and spinal canal: The visualized paraspinal soft tissues are unremarkable. No prevertebral soft tissue swelling is seen. The  spinal canal is grossly unremarkable, no large epidural collection or significant canal narrowing. Disc levels: Cervical spine spondylosis is seen with anterior osteophytes, disc osteophyte complex and uncovertebral osteophytes most notable at C3-C4 with moderate neural foraminal narrowing and mild effacement anterior thecal sac. Upper chest: The lung apices are clear. Thoracic inlet is within normal limits. Other: None IMPRESSION: No acute intracranial abnormality. No acute facial fracture Soft tissue swelling around the right periorbital and upper maxillary face. No acute fracture or malalignment of the spine. Electronically Signed   By: Prudencio Pair M.D.   On: 12/14/2019 21:16   CT Cervical Spine Wo Contrast  Result Date: 12/14/2019 CLINICAL DATA:  Collided with another baseball player EXAM: CT HEAD WITHOUT CONTRAST TECHNIQUE: Contiguous axial images were obtained from the base of the skull through the vertex without intravenous contrast. COMPARISON:  None. FINDINGS: Brain: No evidence of acute territorial infarction, hemorrhage, hydrocephalus,extra-axial collection or mass lesion/mass effect. Normal gray-white differentiation. Ventricles are normal in size and contour. Vascular: No hyperdense vessel or unexpected calcification. Skull: The skull is intact. No fracture or focal lesion identified. Sinuses/Orbits: The visualized paranasal sinuses and mastoid air cells are clear. The orbits and globes intact. Other: None Face: Osseous: No acute fracture or other significant osseous abnormality.The nasal bone, mandibles, zygomatic arches and pterygoid plates are intact. Orbits: No fracture identified. Unremarkable appearance of globes and orbits. Sinuses: The visualized paranasal sinuses and mastoid air cells are unremarkable. Soft tissues: There is right-sided periorbital and upper maxillary soft tissue swelling. Limited intracranial: No acute findings. Cervical spine: Alignment: There is straightening of the  normal cervical lordosis. Skull base and vertebrae: Visualized skull base is intact. No atlanto-occipital dissociation. The vertebral body heights are well maintained. No fracture or pathologic osseous lesion seen. Soft tissues and spinal canal: The visualized paraspinal soft tissues are unremarkable. No prevertebral soft tissue swelling is seen. The spinal canal is grossly unremarkable, no large epidural collection or significant canal narrowing. Disc levels: Cervical spine spondylosis is seen with anterior osteophytes, disc osteophyte complex and uncovertebral osteophytes most notable at C3-C4 with moderate neural foraminal narrowing and mild effacement anterior thecal sac. Upper chest: The lung apices are clear. Thoracic inlet is within normal limits. Other: None IMPRESSION: No acute intracranial abnormality. No acute facial fracture Soft  tissue swelling around the right periorbital and upper maxillary face. No acute fracture or malalignment of the spine. Electronically Signed   By: Prudencio Pair M.D.   On: 12/14/2019 21:16   DG Chest Port 1 View  Result Date: 12/14/2019 CLINICAL DATA:  69 year old male with trauma. EXAM: PORTABLE CHEST 1 VIEW COMPARISON:  Chest radiograph dated 11/13/2005 FINDINGS: No focal consolidation, pleural effusion, or pneumothorax. The cardiac silhouette is within limits. No acute osseous pathology. IMPRESSION: No active disease. Electronically Signed   By: Anner Crete M.D.   On: 12/14/2019 21:14   CT Maxillofacial WO CM  Result Date: 12/14/2019 CLINICAL DATA:  Collided with another baseball player EXAM: CT HEAD WITHOUT CONTRAST TECHNIQUE: Contiguous axial images were obtained from the base of the skull through the vertex without intravenous contrast. COMPARISON:  None. FINDINGS: Brain: No evidence of acute territorial infarction, hemorrhage, hydrocephalus,extra-axial collection or mass lesion/mass effect. Normal gray-white differentiation. Ventricles are normal in size and  contour. Vascular: No hyperdense vessel or unexpected calcification. Skull: The skull is intact. No fracture or focal lesion identified. Sinuses/Orbits: The visualized paranasal sinuses and mastoid air cells are clear. The orbits and globes intact. Other: None Face: Osseous: No acute fracture or other significant osseous abnormality.The nasal bone, mandibles, zygomatic arches and pterygoid plates are intact. Orbits: No fracture identified. Unremarkable appearance of globes and orbits. Sinuses: The visualized paranasal sinuses and mastoid air cells are unremarkable. Soft tissues: There is right-sided periorbital and upper maxillary soft tissue swelling. Limited intracranial: No acute findings. Cervical spine: Alignment: There is straightening of the normal cervical lordosis. Skull base and vertebrae: Visualized skull base is intact. No atlanto-occipital dissociation. The vertebral body heights are well maintained. No fracture or pathologic osseous lesion seen. Soft tissues and spinal canal: The visualized paraspinal soft tissues are unremarkable. No prevertebral soft tissue swelling is seen. The spinal canal is grossly unremarkable, no large epidural collection or significant canal narrowing. Disc levels: Cervical spine spondylosis is seen with anterior osteophytes, disc osteophyte complex and uncovertebral osteophytes most notable at C3-C4 with moderate neural foraminal narrowing and mild effacement anterior thecal sac. Upper chest: The lung apices are clear. Thoracic inlet is within normal limits. Other: None IMPRESSION: No acute intracranial abnormality. No acute facial fracture Soft tissue swelling around the right periorbital and upper maxillary face. No acute fracture or malalignment of the spine. Electronically Signed   By: Prudencio Pair M.D.   On: 12/14/2019 21:16    Procedures Procedures (including critical care time)  Medications Ordered in ED Medications  ketorolac (TORADOL) 15 MG/ML injection 15 mg  (15 mg Intravenous Given 12/14/19 2230)  acetaminophen (TYLENOL) tablet 1,000 mg (1,000 mg Oral Given 12/14/19 2230)    ED Course  I have reviewed the triage vital signs and the nursing notes.  Pertinent labs & imaging results that were available during my care of the patient were reviewed by me and considered in my medical decision making (see chart for details).    MDM Rules/Calculators/A&P                         Patient presents with a loss of consciousness head injury while playing baseball.  CT does not show any acute findings.  Patient had some amnesia to the event.  His mental status however is clear.  Patient does recall for normal information.  Speech is clear.  At this time most consistent with concussion.  Patient is not on any anticoagulants.  He  also had pain to the left chest with movement.  X-rays do not show any acute fractures or pneumothorax.  This is lessened since the patient is in the emergency department.  I have performed repeat examination of the abdomen and there is no sign of tenderness in the upper quadrants to suggest renal or splenic injury.  Patient is not having any difficulty breathing.  At this time stable for discharge with return precautions reviewed. Final Clinical Impression(s) / ED Diagnoses Final diagnoses:  Injury of head, initial encounter  Concussion with loss of consciousness of 30 minutes or less, initial encounter  Lip laceration, initial encounter  Chest wall contusion, left, initial encounter    Rx / DC Orders ED Discharge Orders         Ordered    chlorhexidine (PERIDEX) 0.12 % solution  2 times daily     Discontinue  Reprint     12/14/19 2316           Charlesetta Shanks, MD 12/14/19 2323

## 2019-12-14 NOTE — ED Triage Notes (Signed)
Patient arrived EMS for colliding with another baseball player that resulted in being unconscious for 2-3 minutes. C/O chest pain and left sided underarm pain. On arrival to ER A/O X4  BP 160/90 SR HR 62 97% RA CBG 122

## 2019-12-14 NOTE — ED Notes (Signed)
Patient transported to CT 

## 2019-12-15 DIAGNOSIS — S060X9A Concussion with loss of consciousness of unspecified duration, initial encounter: Secondary | ICD-10-CM | POA: Diagnosis not present

## 2019-12-15 DIAGNOSIS — Z6830 Body mass index (BMI) 30.0-30.9, adult: Secondary | ICD-10-CM | POA: Diagnosis not present

## 2019-12-15 DIAGNOSIS — E6609 Other obesity due to excess calories: Secondary | ICD-10-CM | POA: Diagnosis not present

## 2020-01-04 DIAGNOSIS — I129 Hypertensive chronic kidney disease with stage 1 through stage 4 chronic kidney disease, or unspecified chronic kidney disease: Secondary | ICD-10-CM | POA: Diagnosis not present

## 2020-01-04 DIAGNOSIS — H401132 Primary open-angle glaucoma, bilateral, moderate stage: Secondary | ICD-10-CM | POA: Diagnosis not present

## 2020-01-04 DIAGNOSIS — N270 Small kidney, unilateral: Secondary | ICD-10-CM | POA: Diagnosis not present

## 2020-01-04 DIAGNOSIS — D841 Defects in the complement system: Secondary | ICD-10-CM | POA: Diagnosis not present

## 2020-01-04 DIAGNOSIS — N1831 Chronic kidney disease, stage 3a: Secondary | ICD-10-CM | POA: Diagnosis not present

## 2020-01-06 DIAGNOSIS — D841 Defects in the complement system: Secondary | ICD-10-CM | POA: Diagnosis not present

## 2020-01-06 DIAGNOSIS — N1831 Chronic kidney disease, stage 3a: Secondary | ICD-10-CM | POA: Diagnosis not present

## 2020-01-06 DIAGNOSIS — E211 Secondary hyperparathyroidism, not elsewhere classified: Secondary | ICD-10-CM | POA: Diagnosis not present

## 2020-01-06 DIAGNOSIS — I129 Hypertensive chronic kidney disease with stage 1 through stage 4 chronic kidney disease, or unspecified chronic kidney disease: Secondary | ICD-10-CM | POA: Diagnosis not present

## 2020-03-08 DIAGNOSIS — Z23 Encounter for immunization: Secondary | ICD-10-CM | POA: Diagnosis not present

## 2020-04-07 DIAGNOSIS — N1831 Chronic kidney disease, stage 3a: Secondary | ICD-10-CM | POA: Diagnosis not present

## 2020-04-07 DIAGNOSIS — E211 Secondary hyperparathyroidism, not elsewhere classified: Secondary | ICD-10-CM | POA: Diagnosis not present

## 2020-04-07 DIAGNOSIS — D841 Defects in the complement system: Secondary | ICD-10-CM | POA: Diagnosis not present

## 2020-04-07 DIAGNOSIS — I129 Hypertensive chronic kidney disease with stage 1 through stage 4 chronic kidney disease, or unspecified chronic kidney disease: Secondary | ICD-10-CM | POA: Diagnosis not present

## 2020-04-13 DIAGNOSIS — N1831 Chronic kidney disease, stage 3a: Secondary | ICD-10-CM | POA: Diagnosis not present

## 2020-04-13 DIAGNOSIS — E211 Secondary hyperparathyroidism, not elsewhere classified: Secondary | ICD-10-CM | POA: Diagnosis not present

## 2020-04-13 DIAGNOSIS — D841 Defects in the complement system: Secondary | ICD-10-CM | POA: Diagnosis not present

## 2020-04-13 DIAGNOSIS — I129 Hypertensive chronic kidney disease with stage 1 through stage 4 chronic kidney disease, or unspecified chronic kidney disease: Secondary | ICD-10-CM | POA: Diagnosis not present

## 2020-04-19 DIAGNOSIS — I129 Hypertensive chronic kidney disease with stage 1 through stage 4 chronic kidney disease, or unspecified chronic kidney disease: Secondary | ICD-10-CM | POA: Diagnosis not present

## 2020-04-19 DIAGNOSIS — D841 Defects in the complement system: Secondary | ICD-10-CM | POA: Diagnosis not present

## 2020-04-19 DIAGNOSIS — E211 Secondary hyperparathyroidism, not elsewhere classified: Secondary | ICD-10-CM | POA: Diagnosis not present

## 2020-04-19 DIAGNOSIS — N1831 Chronic kidney disease, stage 3a: Secondary | ICD-10-CM | POA: Diagnosis not present

## 2020-04-27 DIAGNOSIS — I129 Hypertensive chronic kidney disease with stage 1 through stage 4 chronic kidney disease, or unspecified chronic kidney disease: Secondary | ICD-10-CM | POA: Diagnosis not present

## 2020-04-27 DIAGNOSIS — D841 Defects in the complement system: Secondary | ICD-10-CM | POA: Diagnosis not present

## 2020-04-27 DIAGNOSIS — N1831 Chronic kidney disease, stage 3a: Secondary | ICD-10-CM | POA: Diagnosis not present

## 2020-05-12 DIAGNOSIS — H401132 Primary open-angle glaucoma, bilateral, moderate stage: Secondary | ICD-10-CM | POA: Diagnosis not present

## 2020-07-12 DIAGNOSIS — Z6832 Body mass index (BMI) 32.0-32.9, adult: Secondary | ICD-10-CM | POA: Diagnosis not present

## 2020-07-12 DIAGNOSIS — Z23 Encounter for immunization: Secondary | ICD-10-CM | POA: Diagnosis not present

## 2020-07-12 DIAGNOSIS — Z0000 Encounter for general adult medical examination without abnormal findings: Secondary | ICD-10-CM | POA: Diagnosis not present

## 2020-07-12 DIAGNOSIS — E7849 Other hyperlipidemia: Secondary | ICD-10-CM | POA: Diagnosis not present

## 2020-07-12 DIAGNOSIS — R7309 Other abnormal glucose: Secondary | ICD-10-CM | POA: Diagnosis not present

## 2020-07-12 DIAGNOSIS — Z1331 Encounter for screening for depression: Secondary | ICD-10-CM | POA: Diagnosis not present

## 2020-07-13 ENCOUNTER — Other Ambulatory Visit (HOSPITAL_COMMUNITY)
Admission: RE | Admit: 2020-07-13 | Discharge: 2020-07-13 | Disposition: A | Discharge status: Home / Self Care | Payer: Medicare PPO | Source: Ambulatory Visit | Attending: Nephrology | Admitting: Nephrology

## 2020-07-13 ENCOUNTER — Other Ambulatory Visit: Payer: Self-pay

## 2020-07-13 DIAGNOSIS — I129 Hypertensive chronic kidney disease with stage 1 through stage 4 chronic kidney disease, or unspecified chronic kidney disease: Secondary | ICD-10-CM | POA: Insufficient documentation

## 2020-07-13 DIAGNOSIS — I272 Pulmonary hypertension, unspecified: Secondary | ICD-10-CM | POA: Diagnosis not present

## 2020-07-13 DIAGNOSIS — N1831 Chronic kidney disease, stage 3a: Secondary | ICD-10-CM | POA: Diagnosis not present

## 2020-07-13 DIAGNOSIS — D841 Defects in the complement system: Secondary | ICD-10-CM | POA: Diagnosis not present

## 2020-07-13 LAB — RENAL FUNCTION PANEL
Albumin: 3.9 g/dL (ref 3.5–5.0)
Anion gap: 8 (ref 5–15)
BUN: 16 mg/dL (ref 8–23)
CO2: 25 mmol/L (ref 22–32)
Calcium: 9 mg/dL (ref 8.9–10.3)
Chloride: 103 mmol/L (ref 98–111)
Creatinine, Ser: 1.4 mg/dL — ABNORMAL HIGH (ref 0.61–1.24)
GFR, Estimated: 54 mL/min — ABNORMAL LOW (ref >60)
Glucose, Bld: 104 mg/dL — ABNORMAL HIGH (ref 70–99)
Phosphorus: 3.2 mg/dL (ref 2.5–4.6)
Potassium: 4.1 mmol/L (ref 3.5–5.1)
Sodium: 136 mmol/L (ref 135–145)

## 2020-07-13 LAB — CBC
HCT: 42.9 % (ref 39.0–52.0)
Hemoglobin: 14.5 g/dL (ref 13.0–17.0)
MCH: 29.7 pg (ref 26.0–34.0)
MCHC: 33.8 g/dL (ref 30.0–36.0)
MCV: 87.9 fL (ref 80.0–100.0)
Platelets: 177 10*3/uL (ref 150–400)
RBC: 4.88 MIL/uL (ref 4.22–5.81)
RDW: 14 % (ref 11.5–15.5)
WBC: 9.5 10*3/uL (ref 4.0–10.5)
nRBC: 0 % (ref 0.0–0.2)

## 2020-07-13 LAB — PROTEIN / CREATININE RATIO, URINE
Creatinine, Urine: 223.23 mg/dL
Protein Creatinine Ratio: 0.04 mg/mg{Cre} (ref 0.00–0.15)
Total Protein, Urine: 10 mg/dL

## 2020-09-06 IMAGING — CT CT HEAD W/O CM
3 series · 15 of 47 positions shown, 18 images · non-contrast
Comparison: None.

CLINICAL DATA: Collided with another baseball player

EXAM:
CT HEAD WITHOUT CONTRAST
TECHNIQUE: Contiguous axial images were obtained from the base of the skull
through the vertex without intravenous contrast.

[Series 3: head 5.0 h30s · axial · 0.42mm/px · z∈[-136,+4]mm · 9 of 34 slices shown, 12 images]
[im 3/34  brain]
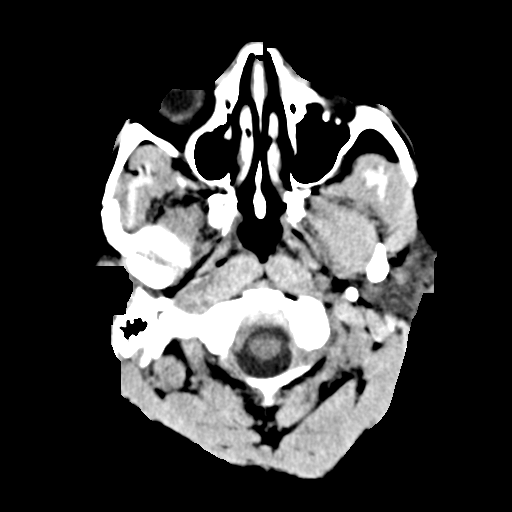
[im 3/34  bone]
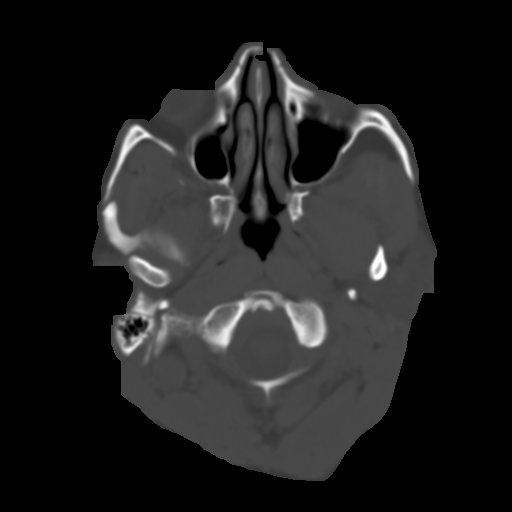
[im 6/34  brain]
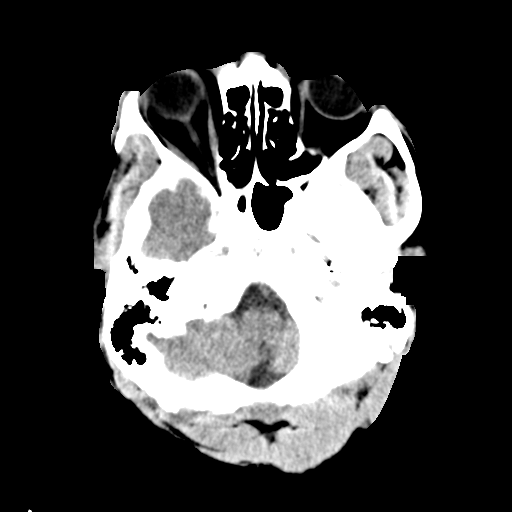
[im 10/34  brain]
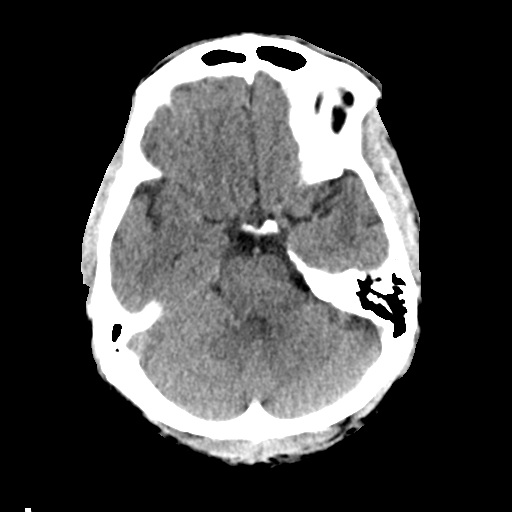
[im 13/34  brain]
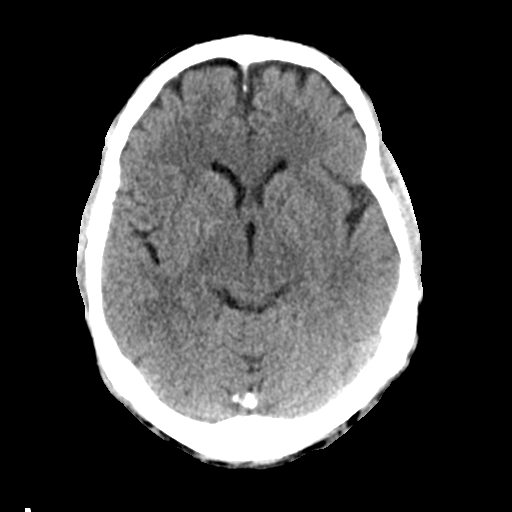
[im 18/34  brain]
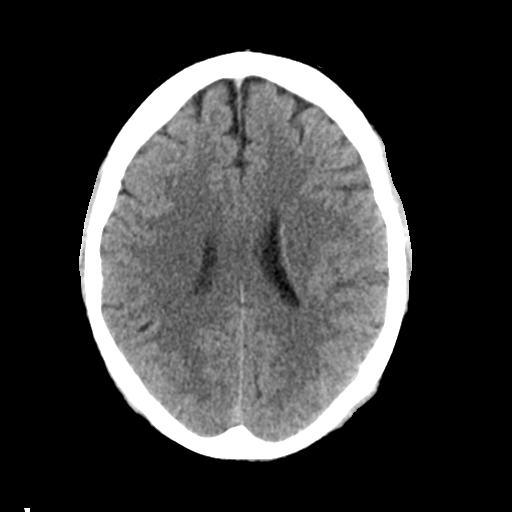
[im 18/34  bone]
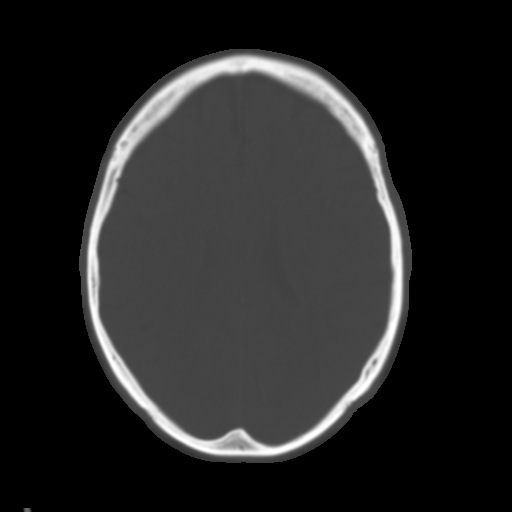
[im 21/34  brain]
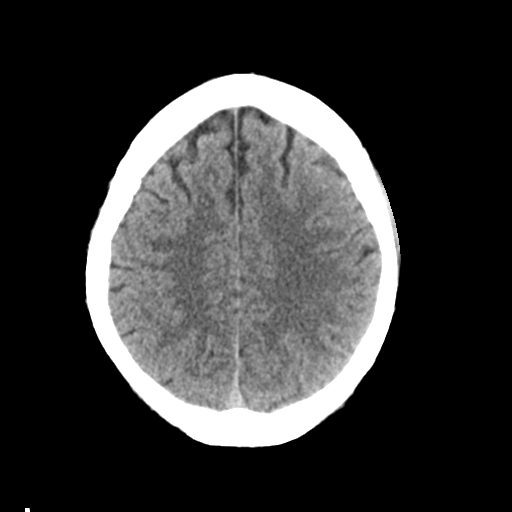
[im 24/34  brain]
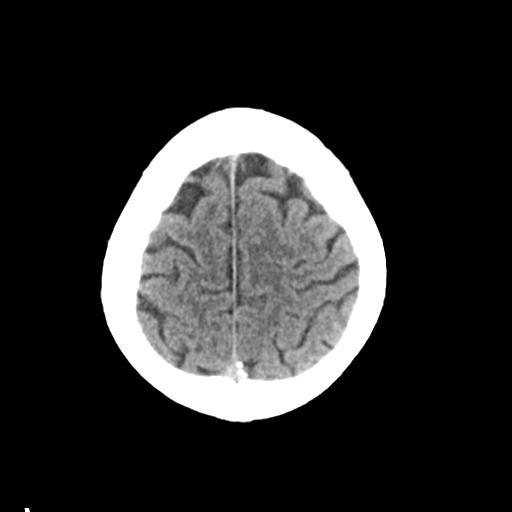
[im 28/34  brain]
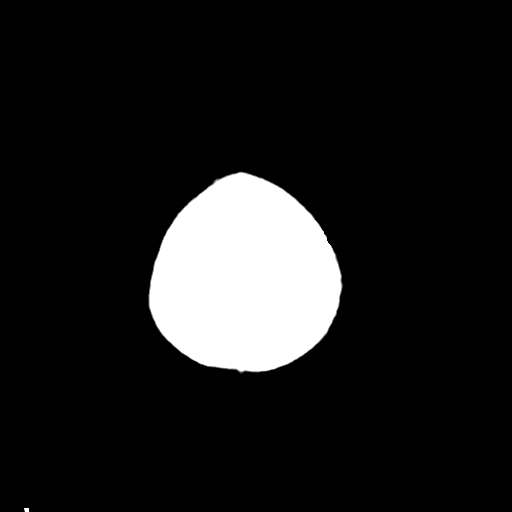
[im 31/34  brain]
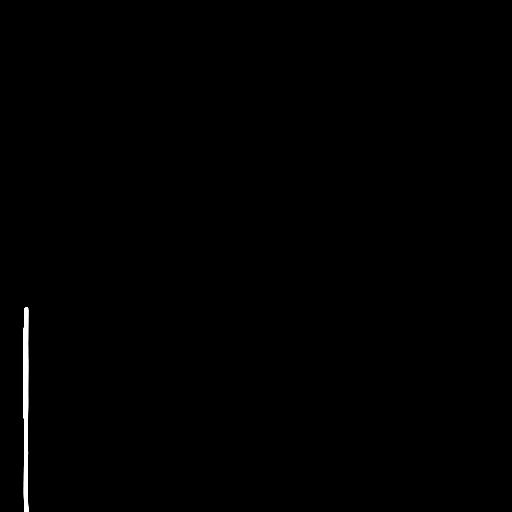
[im 31/34  bone]
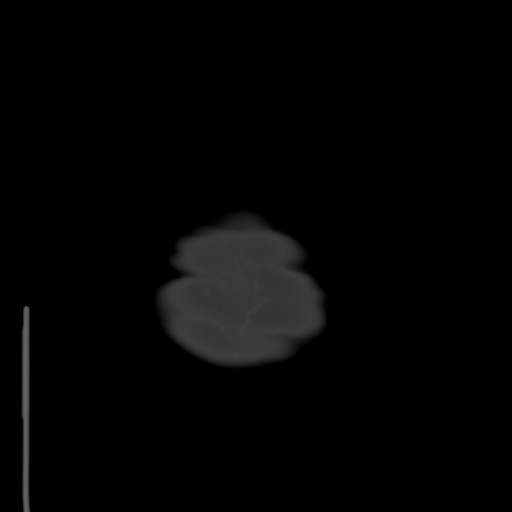

[Series 5: head 3.0 mpr cor · coronal · 0.33mm/px · 3 of 69 slices shown]
[im 23/69  brain]
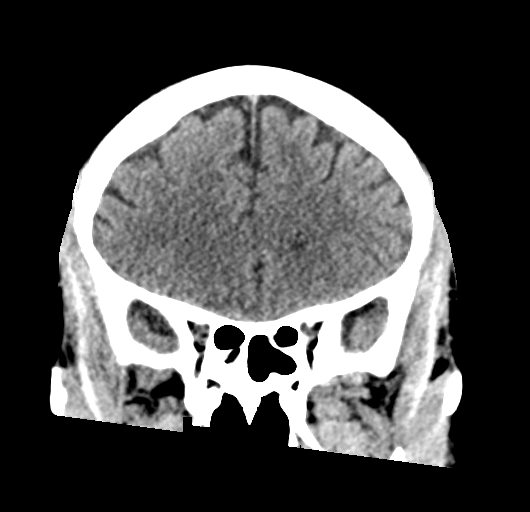
[im 31/69  brain]
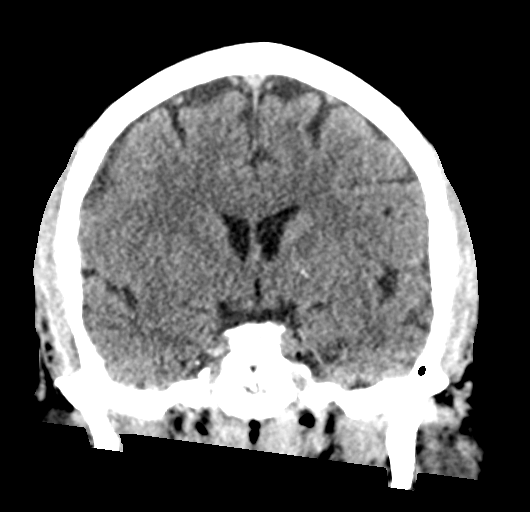
[im 38/69  brain]
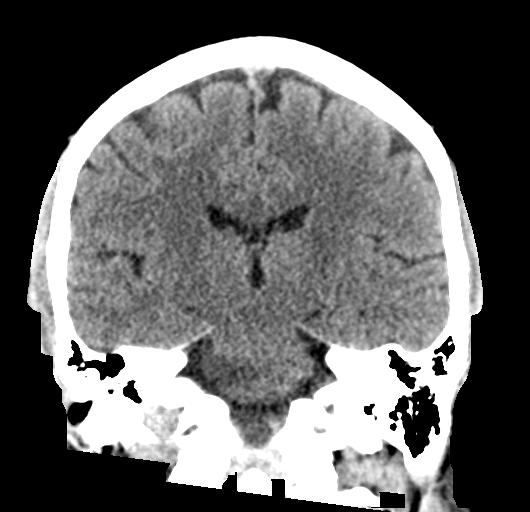

[Series 6: head 3.0 mpr sag · sagittal · 0.32mm/px · 3 of 60 slices shown]
[im 20/60  brain]
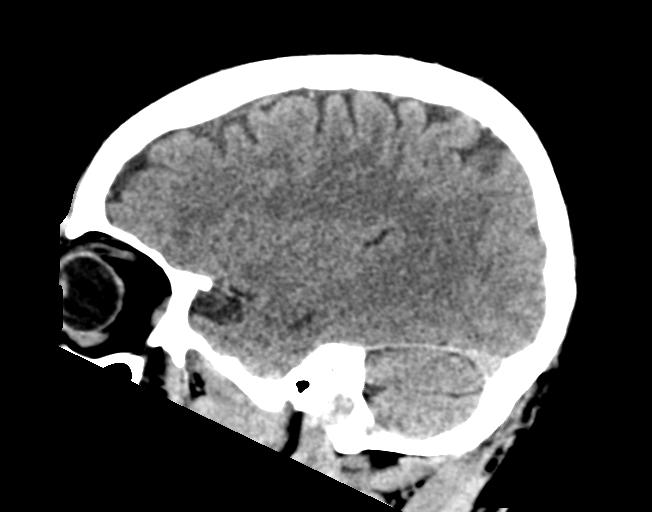
[im 30/60  brain]
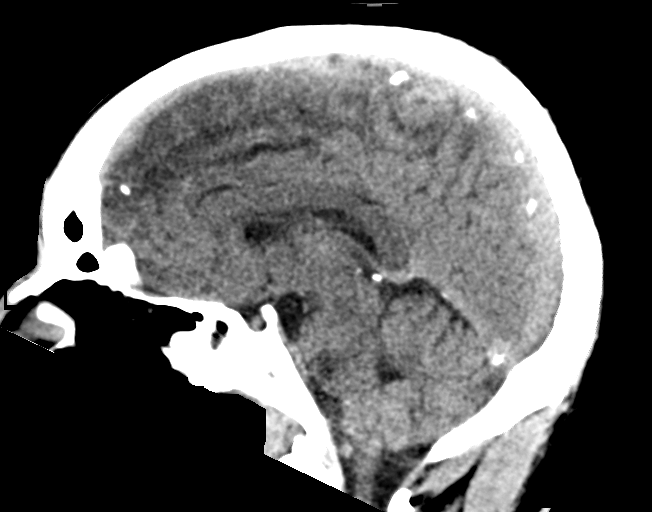
[im 40/60  brain]
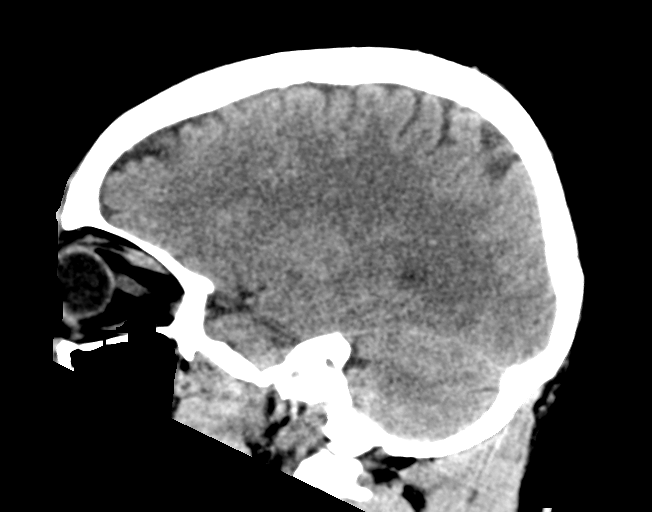

[15 of 47 positions shown; findings below may reference images not displayed]

FINDINGS: Brain: No evidence of acute territorial infarction, hemorrhage,
hydrocephalus,extra-axial collection or mass lesion/mass effect.
Normal gray-white differentiation. Ventricles are normal in size and
contour.

Vascular: No hyperdense vessel or unexpected calcification.

Skull: The skull is intact. No fracture or focal lesion identified.

Sinuses/Orbits: The visualized paranasal sinuses and mastoid air
cells are clear. The orbits and globes intact.

Other: None

Face:

Osseous: No acute fracture or other significant osseous
abnormality.The nasal bone, mandibles, zygomatic arches and
pterygoid plates are intact.

Orbits: No fracture identified. Unremarkable appearance of globes
and orbits.

Sinuses: The visualized paranasal sinuses and mastoid air cells are
unremarkable.

Soft tissues: There is right-sided periorbital and upper maxillary
soft tissue swelling.

Limited intracranial: No acute findings.

Cervical spine:

Alignment: There is straightening of the normal cervical lordosis.

Skull base and vertebrae: Visualized skull base is intact. No
atlanto-occipital dissociation. The vertebral body heights are well
maintained. No fracture or pathologic osseous lesion seen.

Soft tissues and spinal canal: The visualized paraspinal soft
tissues are unremarkable. No prevertebral soft tissue swelling is
seen. The spinal canal is grossly unremarkable, no large epidural
collection or significant canal narrowing.

Disc levels: Cervical spine spondylosis is seen with anterior
osteophytes, disc osteophyte complex and uncovertebral osteophytes
most notable at C3-C4 with moderate neural foraminal narrowing and
mild effacement anterior thecal sac.

Upper chest: The lung apices are clear. Thoracic inlet is within
normal limits.

Other: None
IMPRESSION: No acute intracranial abnormality.

No acute facial fracture

Soft tissue swelling around the right periorbital and upper
maxillary face.

No acute fracture or malalignment of the spine.

## 2020-09-07 DIAGNOSIS — I272 Pulmonary hypertension, unspecified: Secondary | ICD-10-CM | POA: Diagnosis not present

## 2020-09-07 DIAGNOSIS — D841 Defects in the complement system: Secondary | ICD-10-CM | POA: Diagnosis not present

## 2020-09-07 DIAGNOSIS — I129 Hypertensive chronic kidney disease with stage 1 through stage 4 chronic kidney disease, or unspecified chronic kidney disease: Secondary | ICD-10-CM | POA: Diagnosis not present

## 2020-09-07 DIAGNOSIS — N1831 Chronic kidney disease, stage 3a: Secondary | ICD-10-CM | POA: Diagnosis not present

## 2020-09-14 DIAGNOSIS — H524 Presbyopia: Secondary | ICD-10-CM | POA: Diagnosis not present

## 2020-09-14 DIAGNOSIS — H25813 Combined forms of age-related cataract, bilateral: Secondary | ICD-10-CM | POA: Diagnosis not present

## 2020-09-14 DIAGNOSIS — H401132 Primary open-angle glaucoma, bilateral, moderate stage: Secondary | ICD-10-CM | POA: Diagnosis not present

## 2020-09-14 DIAGNOSIS — H5213 Myopia, bilateral: Secondary | ICD-10-CM | POA: Diagnosis not present

## 2020-10-04 DIAGNOSIS — I129 Hypertensive chronic kidney disease with stage 1 through stage 4 chronic kidney disease, or unspecified chronic kidney disease: Secondary | ICD-10-CM | POA: Diagnosis not present

## 2020-10-04 DIAGNOSIS — D841 Defects in the complement system: Secondary | ICD-10-CM | POA: Diagnosis not present

## 2020-10-04 DIAGNOSIS — N1831 Chronic kidney disease, stage 3a: Secondary | ICD-10-CM | POA: Diagnosis not present

## 2020-10-13 DIAGNOSIS — N1831 Chronic kidney disease, stage 3a: Secondary | ICD-10-CM | POA: Diagnosis not present

## 2020-10-13 DIAGNOSIS — I129 Hypertensive chronic kidney disease with stage 1 through stage 4 chronic kidney disease, or unspecified chronic kidney disease: Secondary | ICD-10-CM | POA: Diagnosis not present

## 2020-10-13 DIAGNOSIS — D841 Defects in the complement system: Secondary | ICD-10-CM | POA: Diagnosis not present

## 2020-10-13 DIAGNOSIS — I272 Pulmonary hypertension, unspecified: Secondary | ICD-10-CM | POA: Diagnosis not present

## 2020-11-03 ENCOUNTER — Encounter: Payer: Self-pay | Admitting: Internal Medicine

## 2020-12-19 DIAGNOSIS — U071 COVID-19: Secondary | ICD-10-CM | POA: Diagnosis not present

## 2020-12-29 DIAGNOSIS — H401132 Primary open-angle glaucoma, bilateral, moderate stage: Secondary | ICD-10-CM | POA: Diagnosis not present

## 2020-12-29 DIAGNOSIS — H2513 Age-related nuclear cataract, bilateral: Secondary | ICD-10-CM | POA: Diagnosis not present

## 2021-01-10 DIAGNOSIS — D841 Defects in the complement system: Secondary | ICD-10-CM | POA: Diagnosis not present

## 2021-01-10 DIAGNOSIS — I129 Hypertensive chronic kidney disease with stage 1 through stage 4 chronic kidney disease, or unspecified chronic kidney disease: Secondary | ICD-10-CM | POA: Diagnosis not present

## 2021-01-10 DIAGNOSIS — I272 Pulmonary hypertension, unspecified: Secondary | ICD-10-CM | POA: Diagnosis not present

## 2021-01-10 DIAGNOSIS — N1831 Chronic kidney disease, stage 3a: Secondary | ICD-10-CM | POA: Diagnosis not present

## 2021-01-18 DIAGNOSIS — R809 Proteinuria, unspecified: Secondary | ICD-10-CM | POA: Diagnosis not present

## 2021-01-18 DIAGNOSIS — N1831 Chronic kidney disease, stage 3a: Secondary | ICD-10-CM | POA: Diagnosis not present

## 2021-01-18 DIAGNOSIS — I129 Hypertensive chronic kidney disease with stage 1 through stage 4 chronic kidney disease, or unspecified chronic kidney disease: Secondary | ICD-10-CM | POA: Diagnosis not present

## 2021-01-18 DIAGNOSIS — I272 Pulmonary hypertension, unspecified: Secondary | ICD-10-CM | POA: Diagnosis not present

## 2021-01-27 DIAGNOSIS — E6609 Other obesity due to excess calories: Secondary | ICD-10-CM | POA: Diagnosis not present

## 2021-01-27 DIAGNOSIS — N401 Enlarged prostate with lower urinary tract symptoms: Secondary | ICD-10-CM | POA: Diagnosis not present

## 2021-01-27 DIAGNOSIS — Z23 Encounter for immunization: Secondary | ICD-10-CM | POA: Diagnosis not present

## 2021-01-27 DIAGNOSIS — N529 Male erectile dysfunction, unspecified: Secondary | ICD-10-CM | POA: Diagnosis not present

## 2021-02-02 DIAGNOSIS — Z23 Encounter for immunization: Secondary | ICD-10-CM | POA: Diagnosis not present

## 2021-04-17 DIAGNOSIS — R809 Proteinuria, unspecified: Secondary | ICD-10-CM | POA: Diagnosis not present

## 2021-04-17 DIAGNOSIS — I129 Hypertensive chronic kidney disease with stage 1 through stage 4 chronic kidney disease, or unspecified chronic kidney disease: Secondary | ICD-10-CM | POA: Diagnosis not present

## 2021-04-17 DIAGNOSIS — I272 Pulmonary hypertension, unspecified: Secondary | ICD-10-CM | POA: Diagnosis not present

## 2021-04-17 DIAGNOSIS — N1831 Chronic kidney disease, stage 3a: Secondary | ICD-10-CM | POA: Diagnosis not present

## 2021-04-20 DIAGNOSIS — N1831 Chronic kidney disease, stage 3a: Secondary | ICD-10-CM | POA: Diagnosis not present

## 2021-04-20 DIAGNOSIS — R809 Proteinuria, unspecified: Secondary | ICD-10-CM | POA: Diagnosis not present

## 2021-04-20 DIAGNOSIS — I129 Hypertensive chronic kidney disease with stage 1 through stage 4 chronic kidney disease, or unspecified chronic kidney disease: Secondary | ICD-10-CM | POA: Diagnosis not present

## 2021-06-27 ENCOUNTER — Ambulatory Visit (INDEPENDENT_AMBULATORY_CARE_PROVIDER_SITE_OTHER): Payer: Self-pay | Admitting: *Deleted

## 2021-06-27 ENCOUNTER — Other Ambulatory Visit: Payer: Self-pay

## 2021-06-27 VITALS — Ht 66.0 in | Wt 211.0 lb

## 2021-06-27 DIAGNOSIS — Z86010 Personal history of colonic polyps: Secondary | ICD-10-CM

## 2021-06-27 NOTE — Progress Notes (Signed)
Gastroenterology Pre-Procedure Review  Request Date: 06/27/2021 Requesting Physician: 7 year recall, Last TCS done 11/19/2013 by Dr. Gala Romney, tubular adenoma  PATIENT REVIEW QUESTIONS: The patient responded to the following health history questions as indicated:    1. Diabetes Melitis: no 2. Joint replacements in the past 12 months: no 3. Major health problems in the past 3 months: no 4. Has an artificial valve or MVP: no 5. Has a defibrillator: no 6. Has been advised in past to take antibiotics in advance of a procedure like teeth cleaning: no 7. Family history of colon cancer: no  8. Alcohol Use: no 9. Illicit drug Use: no 10. History of sleep apnea: yes, but doesn't use CPAP  11. History of coronary artery or other vascular stents placed within the last 12 months: no 12. History of any prior anesthesia complications: no 13. Body mass index is 34.06 kg/m.    MEDICATIONS & ALLERGIES:    Patient reports the following regarding taking any blood thinners:   Plavix? no Aspirin? no Coumadin? no Brilinta? no Xarelto? no Eliquis? no Pradaxa? no Savaysa? no Effient? no  Patient confirms/reports the following medications:  Current Outpatient Medications  Medication Sig Dispense Refill   Cholecalciferol (VITAMIN D3 PO) Take 1 tablet by mouth daily.     Flaxseed, Linseed, (FLAX SEED OIL) 1300 MG CAPS Take by mouth daily at 6 (six) AM.     latanoprost (XALATAN) 0.005 % ophthalmic solution Place 1 drop into both eyes at bedtime.     Multiple Vitamin (MULTIVITAMIN WITH MINERALS) TABS tablet Take 1 tablet by mouth daily.     sildenafil (REVATIO) 20 MG tablet Take 20-100 mg by mouth daily as needed (erectile dysfunction).      amLODipine (NORVASC) 5 MG tablet Take by mouth daily at 6 (six) AM.     losartan (COZAAR) 50 MG tablet Take by mouth daily at 6 (six) AM.     No current facility-administered medications for this visit.    Patient confirms/reports the following allergies:  No  Known Allergies  No orders of the defined types were placed in this encounter.   AUTHORIZATION INFORMATION Primary Insurance: [facility],  [place] #: [id number],  Group #: [id number] Pre-Cert / Josem Kaufmann required:  Pre-Cert / Auth #:   SCHEDULE INFORMATION: Procedure has been scheduled as follows:  Date: , Time:   Location: [facility] with Dr. Gala Romney  This Gastroenterology Pre-Precedure Review Form is being routed to the following provider(s): Aliene Altes, PA-C

## 2021-06-27 NOTE — Progress Notes (Signed)
OK to schedule with Dr. Gala Romney with propofol as I believe all procedures will be with propofol moving forward. ASA II.

## 2021-06-28 NOTE — Progress Notes (Signed)
Pt made aware that I will contact him once Dr. Roseanne Kaufman procedure schedules have been released.

## 2021-07-04 DIAGNOSIS — R809 Proteinuria, unspecified: Secondary | ICD-10-CM | POA: Diagnosis not present

## 2021-07-04 DIAGNOSIS — N1831 Chronic kidney disease, stage 3a: Secondary | ICD-10-CM | POA: Diagnosis not present

## 2021-07-04 DIAGNOSIS — D631 Anemia in chronic kidney disease: Secondary | ICD-10-CM | POA: Diagnosis not present

## 2021-07-04 DIAGNOSIS — I129 Hypertensive chronic kidney disease with stage 1 through stage 4 chronic kidney disease, or unspecified chronic kidney disease: Secondary | ICD-10-CM | POA: Diagnosis not present

## 2021-07-13 DIAGNOSIS — N1832 Chronic kidney disease, stage 3b: Secondary | ICD-10-CM | POA: Diagnosis not present

## 2021-07-13 DIAGNOSIS — R809 Proteinuria, unspecified: Secondary | ICD-10-CM | POA: Diagnosis not present

## 2021-07-13 DIAGNOSIS — I272 Pulmonary hypertension, unspecified: Secondary | ICD-10-CM | POA: Diagnosis not present

## 2021-07-13 DIAGNOSIS — I129 Hypertensive chronic kidney disease with stage 1 through stage 4 chronic kidney disease, or unspecified chronic kidney disease: Secondary | ICD-10-CM | POA: Diagnosis not present

## 2021-07-17 NOTE — Progress Notes (Signed)
Lmom for pt to call me back. 

## 2021-07-18 ENCOUNTER — Encounter: Payer: Self-pay | Admitting: *Deleted

## 2021-07-20 ENCOUNTER — Telehealth: Payer: Self-pay | Admitting: Internal Medicine

## 2021-07-20 NOTE — Telephone Encounter (Signed)
Patient called back, please return call

## 2021-07-24 ENCOUNTER — Encounter: Payer: Self-pay | Admitting: *Deleted

## 2021-07-24 MED ORDER — PEG 3350-KCL-NA BICARB-NACL 420 G PO SOLR: 4000.0000 mL | Freq: Once | ORAL | 0 refills | Status: AC

## 2021-07-24 NOTE — Telephone Encounter (Signed)
Spoke to pt.  Scheduled procedure for 08/14/2021 at 8:30, arrival at 7:00 at [facility].  Reviewed prep instructions with pt by phone.  Pt aware that RX for Myrna Blazer was sent to his pharmacy.  He is aware to purchase OTC required items as well.  Pt informed that I am mailing out all information.  Confirmed mailing address ?

## 2021-07-24 NOTE — Progress Notes (Signed)
Spoke to pt.  Scheduled procedure for 08/14/2021 at 8:30, arrival at 7:00 at [facility].  Reviewed prep instructions with pt by phone.  Pt aware that RX for Myrna Blazer was sent to his pharmacy.  He is aware to purchase OTC required items as well.  Pt informed that I am mailing out all information.  Confirmed mailing address ?

## 2021-08-03 ENCOUNTER — Telehealth: Payer: Self-pay | Admitting: *Deleted

## 2021-08-03 NOTE — Telephone Encounter (Signed)
Pt called back and is aware

## 2021-08-03 NOTE — Telephone Encounter (Signed)
Lmom informing pt of Pre-op appointment by phone.  Message included me requesting a call back to confirm message. ?

## 2021-08-07 NOTE — Patient Instructions (Signed)
? ? ? ? ? ? Kevin Rose ? 08/07/2021  ?  ? '@PREFPERIOPPHARMACY'$ @ ? ? Your procedure is scheduled on  08/14/2021. ? ? Report to [facility] at  0700  A.M. ? ? Call this number if you have problems the morning of surgery: ? [phone] ? ? Remember: ? Follow the diet and prep instructions given to you by the office. ?  ? Take these medicines the morning of surgery with A SIP OF WATER  ? ?amlodipine. ?  ? ? Do not wear jewelry, make-up or nail polish. ? Do not wear lotions, powders, or perfumes, or deodorant. ? Do not shave 48 hours prior to surgery.  Men may shave face and neck. ? Do not bring valuables to the hospital. ? [facility] is not responsible for any belongings or valuables. ? ?Contacts, dentures or bridgework may not be worn into surgery.  Leave your suitcase in the car.  After surgery it may be brought to your room. ? ?For patients admitted to the hospital, discharge time will be determined by your treatment team. ? ?Patients discharged the day of surgery will not be allowed to drive home and must have someone with them for 24 hours.  ? ? ?Special instructions:   DO NOT smoke tobacco or vape for 24 hours before your procedure. ? ?Please read over the following fact sheets that you were given. ?Anesthesia Post-op Instructions and Care and Recovery After Surgery ?  ? ? ? Colonoscopy, Adult, Care After ?This sheet gives you information about how to care for yourself after your procedure. Your health care provider may also give you more specific instructions. If you have problems or questions, contact your health care provider. ?What can I expect after the procedure? ?After the procedure, it is common to have: ?A small amount of blood in your stool for 24 hours after the procedure. ?Some gas. ?Mild cramping or bloating of your abdomen. ?Follow these instructions at home: ?Eating and drinking ? ?Drink enough fluid to keep your urine pale yellow. ?Follow instructions from your health care provider about eating  or drinking restrictions. ?Resume your normal diet as instructed by your health care provider. Avoid heavy or fried foods that are hard to digest. ?Activity ?Rest as told by your health care provider. ?Avoid sitting for a long time without moving. Get up to take short walks every 1-2 hours. This is important to improve blood flow and breathing. Ask for help if you feel weak or unsteady. ?Return to your normal activities as told by your health care provider. Ask your health care provider what activities are safe for you. ?Managing cramping and bloating ? ?Try walking around when you have cramps or feel bloated. ?Apply heat to your abdomen as told by your health care provider. Use the heat source that your health care provider recommends, such as a moist heat pack or a heating pad. ?Place a towel between your skin and the heat source. ?Leave the heat on for 20-30 minutes. ?Remove the heat if your skin turns bright red. This is especially important if you are unable to feel pain, heat, or cold. You may have a greater risk of getting burned. ?General instructions ?If you were given a sedative during the procedure, it can affect you for several hours. Do not drive or operate machinery until your health care provider says that it is safe. ?For the first 24 hours after the procedure: ?Do not sign important documents. ?Do not drink alcohol. ?Do your  regular daily activities at a slower pace than normal. ?Eat soft foods that are easy to digest. ?Take over-the-counter and prescription medicines only as told by your health care provider. ?Keep all follow-up visits as told by your health care provider. This is important. ?Contact a health care provider if: ?You have blood in your stool 2-3 days after the procedure. ?Get help right away if you have: ?More than a small spotting of blood in your stool. ?Large blood clots in your stool. ?Swelling of your abdomen. ?Nausea or vomiting. ?A fever. ?Increasing pain in your abdomen that  is not relieved with medicine. ?Summary ?After the procedure, it is common to have a small amount of blood in your stool. You may also have mild cramping and bloating of your abdomen. ?If you were given a sedative during the procedure, it can affect you for several hours. Do not drive or operate machinery until your health care provider says that it is safe. ?Get help right away if you have a lot of blood in your stool, nausea or vomiting, a fever, or increased pain in your abdomen. ?This information is not intended to replace advice given to you by your health care provider. Make sure you discuss any questions you have with your health care provider. ?Document Revised: 02/27/2019 Document Reviewed: 11/17/2018 ?Elsevier Patient Education ? [place]. ?Monitored Anesthesia Care, Care After ?This sheet gives you information about how to care for yourself after your procedure. Your health care provider may also give you more specific instructions. If you have problems or questions, contact your health care provider. ?What can I expect after the procedure? ?After the procedure, it is common to have: ?Tiredness. ?Forgetfulness about what happened after the procedure. ?Impaired judgment for important decisions. ?Nausea or vomiting. ?Some difficulty with balance. ?Follow these instructions at home: ?For the time period you were told by your health care provider: ?  ?Rest as needed. ?Do not participate in activities where you could fall or become injured. ?Do not drive or use machinery. ?Do not drink alcohol. ?Do not take sleeping pills or medicines that cause drowsiness. ?Do not make important decisions or sign legal documents. ?Do not take care of children on your own. ?Eating and drinking ?Follow the diet that is recommended by your health care provider. ?Drink enough fluid to keep your urine pale yellow. ?If you vomit: ?Drink water, juice, or soup when you can drink without vomiting. ?Make sure you have little  or no nausea before eating solid foods. ?General instructions ?Have a responsible adult stay with you for the time you are told. It is important to have someone help care for you until you are awake and alert. ?Take over-the-counter and prescription medicines only as told by your health care provider. ?If you have sleep apnea, surgery and certain medicines can increase your risk for breathing problems. Follow instructions from your health care provider about wearing your sleep device: ?Anytime you are sleeping, including during daytime naps. ?While taking prescription pain medicines, sleeping medicines, or medicines that make you drowsy. ?Avoid smoking. ?Keep all follow-up visits as told by your health care provider. This is important. ?Contact a health care provider if: ?You keep feeling nauseous or you keep vomiting. ?You feel light-headed. ?You are still sleepy or having trouble with balance after 24 hours. ?You develop a rash. ?You have a fever. ?You have redness or swelling around the IV site. ?Get help right away if: ?You have trouble breathing. ?You have new-onset confusion at home. ?  Summary ?For several hours after your procedure, you may feel tired. You may also be forgetful and have poor judgment. ?Have a responsible adult stay with you for the time you are told. It is important to have someone help care for you until you are awake and alert. ?Rest as told. Do not drive or operate machinery. Do not drink alcohol or take sleeping pills. ?Get help right away if you have trouble breathing, or if you suddenly become confused. ?This information is not intended to replace advice given to you by your health care provider. Make sure you discuss any questions you have with your health care provider. ?Document Revised: 01/07/2020 Document Reviewed: 03/26/2019 ?Elsevier Patient Education ? [place]. ? ?

## 2021-08-08 ENCOUNTER — Encounter (HOSPITAL_COMMUNITY): Payer: Self-pay

## 2021-08-08 ENCOUNTER — Encounter (HOSPITAL_COMMUNITY)
Admission: RE | Admit: 2021-08-08 | Discharge: 2021-08-08 | Disposition: A | Discharge status: Home / Self Care | Payer: Medicare PPO | Source: Ambulatory Visit | Attending: Internal Medicine | Admitting: Internal Medicine

## 2021-08-08 HISTORY — DX: Sleep apnea, unspecified: G47.30

## 2021-08-08 HISTORY — DX: Essential (primary) hypertension: I10

## 2021-08-14 ENCOUNTER — Other Ambulatory Visit: Payer: Self-pay

## 2021-08-14 ENCOUNTER — Ambulatory Visit (HOSPITAL_COMMUNITY): Payer: Medicare PPO | Admitting: Anesthesiology

## 2021-08-14 ENCOUNTER — Encounter (HOSPITAL_COMMUNITY): Payer: Self-pay | Admitting: Internal Medicine

## 2021-08-14 ENCOUNTER — Ambulatory Visit (HOSPITAL_COMMUNITY)
Admission: RE | Admit: 2021-08-14 | Discharge: 2021-08-14 | Disposition: A | Discharge status: Home / Self Care | Payer: Medicare PPO | Attending: Internal Medicine | Admitting: Internal Medicine

## 2021-08-14 ENCOUNTER — Encounter (HOSPITAL_COMMUNITY)
Admission: RE | Disposition: A | Discharge status: Home / Self Care | Payer: Self-pay | Source: Home / Self Care | Attending: Internal Medicine

## 2021-08-14 ENCOUNTER — Ambulatory Visit (HOSPITAL_BASED_OUTPATIENT_CLINIC_OR_DEPARTMENT_OTHER): Payer: Medicare PPO | Admitting: Anesthesiology

## 2021-08-14 DIAGNOSIS — K635 Polyp of colon: Secondary | ICD-10-CM

## 2021-08-14 DIAGNOSIS — Z79899 Other long term (current) drug therapy: Secondary | ICD-10-CM | POA: Diagnosis not present

## 2021-08-14 DIAGNOSIS — G4730 Sleep apnea, unspecified: Secondary | ICD-10-CM | POA: Insufficient documentation

## 2021-08-14 DIAGNOSIS — Z1211 Encounter for screening for malignant neoplasm of colon: Secondary | ICD-10-CM | POA: Diagnosis not present

## 2021-08-14 DIAGNOSIS — I10 Essential (primary) hypertension: Secondary | ICD-10-CM | POA: Diagnosis not present

## 2021-08-14 DIAGNOSIS — Z86010 Personal history of colonic polyps: Secondary | ICD-10-CM | POA: Diagnosis not present

## 2021-08-14 DIAGNOSIS — D123 Benign neoplasm of transverse colon: Secondary | ICD-10-CM | POA: Insufficient documentation

## 2021-08-14 HISTORY — PX: HEMOSTASIS CLIP PLACEMENT: SHX6857

## 2021-08-14 HISTORY — PX: COLONOSCOPY WITH PROPOFOL: SHX5780

## 2021-08-14 HISTORY — PX: POLYPECTOMY: SHX5525

## 2021-08-14 SURGERY — COLONOSCOPY WITH PROPOFOL
Anesthesia: General

## 2021-08-14 MED ORDER — LACTATED RINGERS IV SOLN: INTRAVENOUS | Status: DC

## 2021-08-14 MED ORDER — LIDOCAINE HCL (PF) 2 % IJ SOLN
INTRAMUSCULAR | Status: AC
  Filled 2021-08-14: qty 5

## 2021-08-14 MED ORDER — PROPOFOL 500 MG/50ML IV EMUL
INTRAVENOUS | Status: DC | PRN
  Administered 2021-08-14: 150 ug/kg/min via INTRAVENOUS

## 2021-08-14 MED ORDER — LIDOCAINE HCL (CARDIAC) PF 100 MG/5ML IV SOSY
PREFILLED_SYRINGE | INTRAVENOUS | Status: DC | PRN
  Administered 2021-08-14: 50 mg via INTRAVENOUS

## 2021-08-14 MED ORDER — PROPOFOL 10 MG/ML IV BOLUS
INTRAVENOUS | Status: DC | PRN
  Administered 2021-08-14: 100 mg via INTRAVENOUS

## 2021-08-14 MED ORDER — EPHEDRINE 5 MG/ML INJ
INTRAVENOUS | Status: AC
  Filled 2021-08-14: qty 5

## 2021-08-14 MED ORDER — EPHEDRINE SULFATE (PRESSORS) 50 MG/ML IJ SOLN
INTRAMUSCULAR | Status: DC | PRN
  Administered 2021-08-14: 5 mg via INTRAVENOUS

## 2021-08-14 MED ORDER — PROPOFOL 1000 MG/100ML IV EMUL
INTRAVENOUS | Status: AC
  Filled 2021-08-14: qty 100

## 2021-08-14 NOTE — H&P (Signed)
?$'@LOGO'a$ @ ? ? ?Primary Care Physician:  Elsie Lincoln, MD ?Primary Gastroenterologist:  Dr. Gala Romney ? ?Pre-Procedure History & Physical: ?HPI:  Kevin Rose is a 71 y.o. male here for here for surveillance colonoscopy.  History of multiple colonic adenomas removed 2016.  No bowel symptoms. ? ?Past Medical History:  ?Diagnosis Date  ? Hypertension   ? Medical history non-contributory   ? Sleep apnea   ? ? ?Past Surgical History:  ?Procedure Laterality Date  ? COLONOSCOPY  07/2003  ? friable anal canal.  ? COLONOSCOPY N/A 11/19/2013  ? Procedure: COLONOSCOPY;  Surgeon: Daneil Dolin, MD;  Location: [facility];  Service: Endoscopy;  Laterality: N/A;  10:30-rescheduled to [place] notified pt  ? SHOULDER ARTHROSCOPY WITH OPEN ROTATOR CUFF REPAIR Right   ? ? ?Prior to Admission medications   ?Medication Sig Start Date End Date Taking? Authorizing Provider  ?amLODipine (NORVASC) 5 MG tablet Take 5 mg by mouth. 04/06/21  Yes [provider]  ?chlorthalidone (HYGROTON) 25 MG tablet Take 12.5 mg by mouth every morning. 07/26/21  Yes [provider]  ?Cholecalciferol (VITAMIN D3 PO) Take 1 tablet by mouth daily.   Yes [provider]  ?Flaxseed, Linseed, (FLAX SEED OIL) 1300 MG CAPS Take 1,300 mg by mouth in the morning and at bedtime.   Yes [provider]  ?latanoprost (XALATAN) 0.005 % ophthalmic solution Place 1 drop into both eyes at bedtime. 09/01/19  Yes [provider]  ?losartan (COZAAR) 25 MG tablet Take 25 mg by mouth daily. 04/20/21  Yes [provider]  ?Multiple Vitamin (MULTIVITAMIN WITH MINERALS) TABS tablet Take 1 tablet by mouth daily.   Yes [provider]  ?polyvinyl alcohol (LIQUIFILM TEARS) 1.4 % ophthalmic solution Place 1 drop into both eyes as needed for dry eyes.   Yes [provider]  ?sildenafil (REVATIO) 20 MG tablet Take 20-100 mg by mouth daily as needed (erectile dysfunction).  11/13/19  Yes [provider]   ? ? ?Allergies as of 07/24/2021  ? (No Known Allergies)  ? ? ?Family History  ?Problem Relation Age of Onset  ? Colon polyps Mother   ? Prostate cancer Father   ? Lung cancer Brother   ? Colon cancer Neg Hx   ? ? ?Social History  ? ?Socioeconomic History  ? Marital status: Married  ?  Spouse name: Not on file  ? Number of children: 2  ? Years of education: Not on file  ? Highest education level: Not on file  ?Occupational History  ? Occupation: [employer]  ?Tobacco Use  ? Smoking status: Never  ? Smokeless tobacco: Never  ?Substance and Sexual Activity  ? Alcohol use: No  ? Drug use: No  ? Sexual activity: Not on file  ?Other Topics Concern  ? Not on file  ?Social History Narrative  ? Not on file  ? ?Social Determinants of Health  ? ?Financial Resource Strain: Not on file  ?Food Insecurity: Not on file  ?Transportation Needs: Not on file  ?Physical Activity: Not on file  ?Stress: Not on file  ?Social Connections: Not on file  ?Intimate Partner Violence: Not on file  ? ? ?Review of Systems: ?See HPI, otherwise negative ROS ? ?Physical Exam: ?BP 121/64   Pulse (!) 59   Temp 98 ?F (36.7 ?C) (Oral)   Resp 11   Ht '5\' 6"'$  (1.676 m)   Wt 94 kg   SpO2 95%   BMI 33.45 kg/m?  ?General:  Alert,  Well-developed, well-nourished, pleasant and cooperative in NAD ?SNeck:  Supple; no masses or thyromegaly. No significant cervical adenopathy. ?Lungs:  Clear throughout to auscultation.   No wheezes, crackles, or rhonchi. No acute distress. ?Heart:  Regular rate and rhythm; no murmurs, clicks, rubs,  or gallops. ?Abdomen: Non-distended, normal bowel sounds.  Soft and nontender without appreciable mass or hepatosplenomegaly.  ?Pulses:  Normal pulses noted. ?Extremities:  Without clubbing or edema. ? ?Impression/Plan: 71 year old with a history of colonic adenoma; here for surveillance colonoscopy. ? ?The risks, benefits, limitations, alternatives and imponderables have been reviewed with the patient. Questions have been  answered. All parties are agreeable.   ? ? ? ? ?Notice: This dictation was prepared with Dragon dictation along with smaller phrase technology. Any transcriptional errors that result from this process are unintentional and may not be corrected upon review.  ?

## 2021-08-14 NOTE — Anesthesia Preprocedure Evaluation (Signed)
Anesthesia Evaluation  ?Patient identified by MRN, date of birth, ID band ?Patient awake ? ? ? ?Reviewed: ?Allergy & Precautions, NPO status , Patient's Chart, lab work & pertinent test results ? ?Airway ?Mallampati: II ? ?TM Distance: >3 FB ?Neck ROM: Full ? ? ? Dental ? ?(+) Dental Advisory Given, Missing ?  ?Pulmonary ?sleep apnea ,  ?  ?Pulmonary exam normal ?breath sounds clear to auscultation ? ? ? ? ? ? Cardiovascular ?Exercise Tolerance: Good ?hypertension, Pt. on medications ?Normal cardiovascular exam ?Rhythm:Regular Rate:Normal ? ? ?  ?Neuro/Psych ?negative neurological ROS ? negative psych ROS  ? GI/Hepatic ?negative GI ROS, Neg liver ROS,   ?Endo/Other  ?negative endocrine ROS ? Renal/GU ?negative Renal ROS  ?negative genitourinary ?  ?Musculoskeletal ?negative musculoskeletal ROS ?(+)  ? Abdominal ?  ?Peds ?negative pediatric ROS ?(+)  Hematology ?negative hematology ROS ?(+)   ?Anesthesia Other Findings ? ? Reproductive/Obstetrics ?negative OB ROS ? ?  ? ? ? ? ? ? ? ? ? ? ? ? ? ?  ?  ? ? ? ? ? ?Anesthesia Physical ?Anesthesia Plan ? ?ASA: 2 ? ?Anesthesia Plan: General  ? ?Post-op Pain Management: Minimal or no pain anticipated  ? ?Induction: Intravenous ? ?PONV Risk Score and Plan: TIVA ? ?Airway Management Planned: Nasal Cannula and Natural Airway ? ?Additional Equipment:  ? ?Intra-op Plan:  ? ?Post-operative Plan:  ? ?Informed Consent: I have reviewed the patients History and Physical, chart, labs and discussed the procedure including the risks, benefits and alternatives for the proposed anesthesia with the patient or authorized representative who has indicated his/her understanding and acceptance.  ? ? ? ?Dental advisory given ? ?Plan Discussed with: CRNA and Surgeon ? ?Anesthesia Plan Comments:   ? ? ? ? ? ?Anesthesia Quick Evaluation ? ?

## 2021-08-14 NOTE — Anesthesia Postprocedure Evaluation (Signed)
Anesthesia Post Note ? ?Patient: Kevin Rose ? ?Procedure(s) Performed: COLONOSCOPY WITH PROPOFOL ?POLYPECTOMY ?HEMOSTASIS CLIP PLACEMENT ? ?Patient location during evaluation: Phase II ?Anesthesia Type: General ?Level of consciousness: awake and alert and oriented ?Pain management: pain level controlled ?Vital Signs Assessment: post-procedure vital signs reviewed and stable ?Respiratory status: spontaneous breathing, nonlabored ventilation and respiratory function stable ?Cardiovascular status: blood pressure returned to baseline and stable ?Postop Assessment: no apparent nausea or vomiting ?Anesthetic complications: no ? ? ?No notable events documented. ? ? ?Last Vitals:  ?Vitals:  ? 08/14/21 0722 08/14/21 0900  ?BP: 121/64 108/68  ?Pulse: (!) 59   ?Resp: 11 16  ?Temp: 36.7 ?C 36.7 ?C  ?SpO2: 95% 96%  ?  ?Last Pain:  ?Vitals:  ? 08/14/21 0900  ?TempSrc: Oral  ?PainSc: Asleep  ? ? ?  ?  ?  ?  ?  ?  ? ?Latese Dufault C Ax[place] ? ? ? ? ?

## 2021-08-14 NOTE — Transfer of Care (Signed)
Immediate Anesthesia Transfer of Care Note ? ?Patient: Kevin Rose ? ?Procedure(s) Performed: COLONOSCOPY WITH PROPOFOL ?POLYPECTOMY ?HEMOSTASIS CLIP PLACEMENT ? ?Patient Location: PACU ? ?Anesthesia Type:General ? ?Level of Consciousness: awake, alert , oriented and patient cooperative ? ?Airway & Oxygen Therapy: Patient Spontanous Breathing ? ?Post-op Assessment: Report given to RN, Post -op Vital signs reviewed and stable and Patient moving all extremities X 4 ? ?Post vital signs: Reviewed and stable ? ?Last Vitals:  ?Vitals Value Taken Time  ?BP 108/68 08/14/21 0900  ?Temp 36.7 ?C 08/14/21 0900  ?Pulse    ?Resp 16 08/14/21 0900  ?SpO2 96 % 08/14/21 0900  ? ? ?Last Pain:  ?Vitals:  ? 08/14/21 0900  ?TempSrc: Oral  ?PainSc: Asleep  ?   ? ?  ? ?Complications: No notable events documented. ?

## 2021-08-14 NOTE — Discharge Instructions (Signed)
?  Colonoscopy ?Discharge Instructions ? ?Read the instructions outlined below and refer to this sheet in the next few weeks. These discharge instructions provide you with general information on caring for yourself after you leave the hospital. Your doctor may also give you specific instructions. While your treatment has been planned according to the most current medical practices available, unavoidable complications occasionally occur. If you have any problems or questions after discharge, call Dr. Gala Romney at [phone]. ?ACTIVITY ?You may resume your regular activity, but move at a slower pace for the next 24 hours.  ?Take frequent rest periods for the next 24 hours.  ?Walking will help get rid of the air and reduce the bloated feeling in your belly (abdomen).  ?No driving for 24 hours (because of the medicine (anesthesia) used during the test).   ?Do not sign any important legal documents or operate any machinery for 24 hours (because of the anesthesia used during the test).  ?NUTRITION ?Drink plenty of fluids.  ?You may resume your normal diet as instructed by your doctor.  ?Begin with a light meal and progress to your normal diet. Heavy or fried foods are harder to digest and may make you feel sick to your stomach (nauseated).  ?Avoid alcoholic beverages for 24 hours or as instructed.  ?MEDICATIONS ?You may resume your normal medications unless your doctor tells you otherwise.  ?WHAT YOU CAN EXPECT TODAY ?Some feelings of bloating in the abdomen.  ?Passage of more gas than usual.  ?Spotting of blood in your stool or on the toilet paper.  ?IF YOU HAD POLYPS REMOVED DURING THE COLONOSCOPY: ?No aspirin products for 7 days or as instructed.  ?No alcohol for 7 days or as instructed.  ?Eat a soft diet for the next 24 hours.  ?FINDING OUT THE RESULTS OF YOUR TEST ?Not all test results are available during your visit. If your test results are not back during the visit, make an appointment with your caregiver to find out the  results. Do not assume everything is normal if you have not heard from your caregiver or the medical facility. It is important for you to follow up on all of your test results.  ?SEEK IMMEDIATE MEDICAL ATTENTION IF: ?You have more than a spotting of blood in your stool.  ?Your belly is swollen (abdominal distention).  ?You are nauseated or vomiting.  ?You have a temperature over 101.  ?You have abdominal pain or discomfort that is severe or gets worse throughout the day.   ? ? ?2 polyps removed from your colon today ? ?No MRI until clip gone ? ?Further recommendations to follow pending review of pathology report ? ?At patient request, I called Amarii Bordas at [phone] findings ?

## 2021-08-14 NOTE — Op Note (Addendum)
[facility] ?Patient Name: Kevin Rose ?Procedure Date: 08/14/2021 8:15 AM ?MRN: [id number] ?Date of Birth: [birth year] ?Attending MD: Norvel Richards , MD ?CSN: [id number] ?Age: 71 ?Admit Type: Outpatient ?Procedure:                Colonoscopy ?Indications:              High risk colon cancer surveillance: Personal  ?                          history of colonic polyps ?Providers:                Norvel Richards, MD, Hughie Closs RN, RN,  ?                          Aram Candela ?Referring MD:              ?Medicines:                Propofol per Anesthesia ?Complications:            No immediate complications. ?Estimated Blood Loss:     Estimated blood loss was minimal. ?Procedure:                Pre-Anesthesia Assessment: ?                          - Prior to the procedure, a History and Physical  ?                          was performed, and patient medications and  ?                          allergies were reviewed. The patient's tolerance of  ?                          previous anesthesia was also reviewed. The risks  ?                          and benefits of the procedure and the sedation  ?                          options and risks were discussed with the patient.  ?                          All questions were answered, and informed consent  ?                          was obtained. Prior Anticoagulants: The patient has  ?                          taken no previous anticoagulant or antiplatelet  ?                          agents. ASA Grade Assessment: III - A patient with  ?  severe systemic disease. After reviewing the risks  ?                          and benefits, the patient was deemed in  ?                          satisfactory condition to undergo the procedure. ?                          After obtaining informed consent, the colonoscope  ?                          was passed under direct vision. Throughout the  ?                          procedure, the patient's  blood pressure, pulse, and  ?                          oxygen saturations were monitored continuously. The  ?                          [id number]) scope was introduced through the  ?                          anus and advanced to the the cecum, identified by  ?                          appendiceal orifice and ileocecal valve. The  ?                          colonoscopy was performed without difficulty. The  ?                          patient tolerated the procedure well. The quality  ?                          of the bowel preparation was adequate. ?Scope In: 8:41:22 AM ?Scope Out: 8:58:58 AM ?Scope Withdrawal Time: 0 hours 9 minutes 40 seconds  ?Total Procedure Duration: 0 hours 17 minutes 36 seconds  ?Findings: ?     The perianal and digital rectal examinations were normal. ?     The exam was otherwise without abnormality on direct and retroflexion  ?     views. ?     Two pedunculated polyps were found in the hepatic flexure. The polyps  ?     were 7 to 8 mm in size. These polyps were removed with a cold snare.  ?     Resection and retrieval were complete. Estimated blood loss was minimal.  ?     1 of these polypectomy sites wanted to ooze. 1 clip applied to ensure  ?     hemostasis. ?Impression:               - The examination was otherwise normal on direct  ?  and retroflexion views. ?                          - Two 7 to 8 mm polyps at the hepatic flexure,  ?                          removed with a cold snare. Resected and retrieved.  ?                          Clip x1 ?Moderate Sedation: ?     Moderate (conscious) sedation was personally administered by an  ?     anesthesia professional. The following parameters were monitored: oxygen  ?     saturation, heart rate, blood pressure, respiratory rate, EKG, adequacy  ?     of pulmonary ventilation, and response to care. ?Recommendation:           - Patient has a contact number available for  ?                          emergencies. The  signs and symptoms of potential  ?                          delayed complications were discussed with the  ?                          patient. Return to normal activities tomorrow.  ?                          Written discharge instructions were provided to the  ?                          patient. ?                          - Advance diet as tolerated. ?                          - Continue present medications. ?                          - Repeat colonoscopy date to be determined after  ?                          pending pathology results are reviewed for  ?                          surveillance. ?                          - Return to GI office in 6 months. ?Procedure Code(s):        --- Professional --- ?                          [phone], Colonoscopy, flexible; with removal of  ?  tumor(s), polyp(s), or other lesion(s) by snare  ?                          technique ?Diagnosis Code(s):        --- Professional --- ?                          Z86.010, Personal history of colonic polyps ?                          K63.5, Polyp of colon ?CPT copyright 2019 [employer]. All rights reserved. ?The codes documented in this report are preliminary and upon coder review may  ?be revised to meet current compliance requirements. ?Cristopher Estimable. Rhondalyn Clingan, MD ?Norvel Richards, MD ?08/14/2021 9:05:14 AM ?This report has been signed electronically. ?Number of Addenda: 0 ?

## 2021-08-15 LAB — SURGICAL PATHOLOGY

## 2021-08-16 ENCOUNTER — Encounter: Payer: Self-pay | Admitting: Internal Medicine

## 2021-08-16 DIAGNOSIS — E6609 Other obesity due to excess calories: Secondary | ICD-10-CM | POA: Diagnosis not present

## 2021-08-16 DIAGNOSIS — Z125 Encounter for screening for malignant neoplasm of prostate: Secondary | ICD-10-CM | POA: Diagnosis not present

## 2021-08-16 DIAGNOSIS — Z0001 Encounter for general adult medical examination with abnormal findings: Secondary | ICD-10-CM | POA: Diagnosis not present

## 2021-08-16 DIAGNOSIS — Z1331 Encounter for screening for depression: Secondary | ICD-10-CM | POA: Diagnosis not present

## 2021-08-16 DIAGNOSIS — N182 Chronic kidney disease, stage 2 (mild): Secondary | ICD-10-CM | POA: Diagnosis not present

## 2021-08-16 DIAGNOSIS — Z6833 Body mass index (BMI) 33.0-33.9, adult: Secondary | ICD-10-CM | POA: Diagnosis not present

## 2021-08-16 DIAGNOSIS — I10 Essential (primary) hypertension: Secondary | ICD-10-CM | POA: Diagnosis not present

## 2021-08-16 DIAGNOSIS — K644 Residual hemorrhoidal skin tags: Secondary | ICD-10-CM | POA: Diagnosis not present

## 2021-08-16 DIAGNOSIS — E782 Mixed hyperlipidemia: Secondary | ICD-10-CM | POA: Diagnosis not present

## 2021-08-16 DIAGNOSIS — G4730 Sleep apnea, unspecified: Secondary | ICD-10-CM | POA: Diagnosis not present

## 2021-08-17 ENCOUNTER — Encounter (HOSPITAL_COMMUNITY): Payer: Self-pay | Admitting: Internal Medicine

## 2021-09-12 ENCOUNTER — Encounter: Payer: Self-pay | Admitting: *Deleted

## 2021-09-20 DIAGNOSIS — G4730 Sleep apnea, unspecified: Secondary | ICD-10-CM | POA: Diagnosis not present

## 2021-09-26 ENCOUNTER — Encounter: Payer: Self-pay | Admitting: Urology

## 2021-09-26 ENCOUNTER — Ambulatory Visit: Payer: Medicare PPO | Admitting: Urology

## 2021-09-26 VITALS — BP 153/74 | HR 61

## 2021-09-26 DIAGNOSIS — H25813 Combined forms of age-related cataract, bilateral: Secondary | ICD-10-CM | POA: Diagnosis not present

## 2021-09-26 DIAGNOSIS — N5201 Erectile dysfunction due to arterial insufficiency: Secondary | ICD-10-CM | POA: Diagnosis not present

## 2021-09-26 DIAGNOSIS — R972 Elevated prostate specific antigen [PSA]: Secondary | ICD-10-CM

## 2021-09-26 DIAGNOSIS — H52203 Unspecified astigmatism, bilateral: Secondary | ICD-10-CM | POA: Diagnosis not present

## 2021-09-26 DIAGNOSIS — H524 Presbyopia: Secondary | ICD-10-CM | POA: Diagnosis not present

## 2021-09-26 DIAGNOSIS — H5213 Myopia, bilateral: Secondary | ICD-10-CM | POA: Diagnosis not present

## 2021-09-26 DIAGNOSIS — H401132 Primary open-angle glaucoma, bilateral, moderate stage: Secondary | ICD-10-CM | POA: Diagnosis not present

## 2021-09-26 LAB — MICROSCOPIC EXAMINATION
Bacteria, UA: NONE SEEN
Epithelial Cells (non renal): NONE SEEN /hpf (ref 0–10)
RBC, Urine: NONE SEEN /hpf (ref 0–2)
Renal Epithel, UA: NONE SEEN /hpf

## 2021-09-26 LAB — URINALYSIS, ROUTINE W REFLEX MICROSCOPIC
Bilirubin, UA: NEGATIVE
Glucose, UA: NEGATIVE
Ketones, UA: NEGATIVE
Nitrite, UA: NEGATIVE
Protein,UA: NEGATIVE
RBC, UA: NEGATIVE
Specific Gravity, UA: 1.02 (ref 1.005–1.030)
Urobilinogen, Ur: 1 mg/dL (ref 0.2–1.0)
pH, UA: 7 (ref 5.0–7.5)

## 2021-09-26 MED ORDER — SILDENAFIL CITRATE 100 MG PO TABS: ORAL_TABLET | ORAL | 99 refills | Status: DC

## 2021-09-26 NOTE — Progress Notes (Signed)
H&P  Chief Complaint: Elevated PSA  History of Present Illness: 71 year old male sent by Dr. Hilma Favors for evaluation and management of an elevated PSA.  PSA checked on 08/18/2021 returned 5.4.  I have seen him in the past, most recently in 2020.  That was for slight elevation in his PSA as well.  His highest PSA in the past was just under 4.  He has not had ultrasound and biopsy.  He denies significant lower urinary tract symptoms.  He does have ED.  He does take sildenafil up to 60 mg in the time with adequate success.  Past Medical History:  Diagnosis Date   Hypertension    Medical history non-contributory    Sleep apnea     Past Surgical History:  Procedure Laterality Date   COLONOSCOPY  07/2003   friable anal canal.   COLONOSCOPY N/A 11/19/2013   Procedure: COLONOSCOPY;  Surgeon: Daneil Dolin, MD;  [facility];  Service: Endoscopy;  Laterality: N/A;  10:30-rescheduled to [place] notified pt   COLONOSCOPY WITH PROPOFOL N/A 08/14/2021   Procedure: COLONOSCOPY WITH PROPOFOL;  Surgeon: Daneil Dolin, MD;  [facility]: [facility];  Service: Endoscopy;  Laterality: N/A;  8:30 / ASA 2   HEMOSTASIS CLIP PLACEMENT  08/14/2021   Procedure: HEMOSTASIS CLIP PLACEMENT;  Surgeon: Daneil Dolin, MD;  [facility]: [facility];  Service: Endoscopy;;   POLYPECTOMY  08/14/2021   Procedure: POLYPECTOMY;  Surgeon: Daneil Dolin, MD;  [facility];  Service: Endoscopy;;   SHOULDER ARTHROSCOPY WITH OPEN ROTATOR CUFF REPAIR Right     Home Medications:  Allergies as of 09/26/2021   No Known Allergies      Medication List        Accurate as of Sep 26, 2021  8:40 AM. If you have any questions, ask your nurse or doctor.          amLODipine 5 MG tablet Commonly known as: NORVASC Take 5 mg by mouth.   chlorthalidone 25 MG tablet Commonly known as: HYGROTON Take 12.5 mg by mouth every morning.   Flax Seed Oil 1300 MG Caps Take 1,300 mg by mouth in the  morning and at bedtime.   latanoprost 0.005 % ophthalmic solution Commonly known as: XALATAN Place 1 drop into both eyes at bedtime.   losartan 25 MG tablet Commonly known as: COZAAR Take 25 mg by mouth daily.   multivitamin with minerals Tabs tablet Take 1 tablet by mouth daily.   polyvinyl alcohol 1.4 % ophthalmic solution Commonly known as: LIQUIFILM TEARS Place 1 drop into both eyes as needed for dry eyes.   sildenafil 20 MG tablet Commonly known as: REVATIO Take 20-100 mg by mouth daily as needed (erectile dysfunction).   VITAMIN D3 PO Take 1 tablet by mouth daily.        Allergies: No Known Allergies  Family History  Problem Relation Age of Onset   Colon polyps Mother    Prostate cancer Father    Lung cancer Brother    Colon cancer Neg Hx     Social History:  reports that he has never smoked. He has never used smokeless tobacco. He reports that he does not drink alcohol and does not use drugs.  ROS: A complete review of systems was performed.  All systems are negative except for pertinent findings as noted.  Physical Exam:  Vital signs in last 24 hours: There were no vitals taken for this visit. Constitutional:  Alert and oriented, No acute distress Cardiovascular: Regular rate  Respiratory: Normal respiratory effort GI: Abdomen is soft, nontender, nondistended, no abdominal masses. No CVAT.  Genitourinary: Normal uncircumcised male phallus, testes are descended bilaterally and non-tender and without masses, scrotum is normal in appearance without lesions or masses, perineum is normal on inspection.  Normal anal sphincter tone.  Prostate size 40 g, symmetric, nonnodular, nontender. Lymphatic: No lymphadenopathy Neurologic: Grossly intact, no focal deficits Psychiatric: Normal mood and affect  I have reviewed prior pt notes  I have reviewed notes from referring/previous physicians  I have reviewed urinalysis results  I have reviewed prior PSA  results   Impression/Assessment:  Elevated PSA patient with possible family history of prostate cancer.  Exam today benign.  Erectile dysfunction, on sildenafil  Plan:  1.  We will recheck his PSA today.  If still elevated I have discussed the fact to proceed with ultrasound and biopsy which was discussed  2.  Sildenafil prescribed  3.  Follow-up depending on PSA-surveillance if

## 2021-09-27 LAB — PSA: Prostate Specific Ag, Serum: 4.3 ng/mL — ABNORMAL HIGH (ref 0.0–4.0)

## 2021-10-09 ENCOUNTER — Telehealth: Payer: Self-pay

## 2021-10-09 NOTE — Telephone Encounter (Signed)
Letter sent informing pt of results and f/u apt in 1 year.

## 2021-10-09 NOTE — Telephone Encounter (Signed)
-----   Message from Franchot Gallo, MD sent at 10/06/2021  4:34 PM EDT ----- Notify patient that PSA is down to 4.3.  No ultrasound/biopsy needed now.  I j would ust like to see him back in a year. ----- Message ----- From: Audie Box, CMA Sent: 09/27/2021   2:37 PM EDT To: Franchot Gallo, MD  Please review.

## 2021-10-13 DIAGNOSIS — I272 Pulmonary hypertension, unspecified: Secondary | ICD-10-CM | POA: Diagnosis not present

## 2021-10-13 DIAGNOSIS — N1832 Chronic kidney disease, stage 3b: Secondary | ICD-10-CM | POA: Diagnosis not present

## 2021-10-13 DIAGNOSIS — I129 Hypertensive chronic kidney disease with stage 1 through stage 4 chronic kidney disease, or unspecified chronic kidney disease: Secondary | ICD-10-CM | POA: Diagnosis not present

## 2021-10-13 DIAGNOSIS — R809 Proteinuria, unspecified: Secondary | ICD-10-CM | POA: Diagnosis not present

## 2021-10-19 DIAGNOSIS — I272 Pulmonary hypertension, unspecified: Secondary | ICD-10-CM | POA: Diagnosis not present

## 2021-10-19 DIAGNOSIS — R809 Proteinuria, unspecified: Secondary | ICD-10-CM | POA: Diagnosis not present

## 2021-10-19 DIAGNOSIS — I129 Hypertensive chronic kidney disease with stage 1 through stage 4 chronic kidney disease, or unspecified chronic kidney disease: Secondary | ICD-10-CM | POA: Diagnosis not present

## 2021-10-19 DIAGNOSIS — N1832 Chronic kidney disease, stage 3b: Secondary | ICD-10-CM | POA: Diagnosis not present

## 2022-01-24 DIAGNOSIS — N1832 Chronic kidney disease, stage 3b: Secondary | ICD-10-CM | POA: Diagnosis not present

## 2022-01-24 DIAGNOSIS — R809 Proteinuria, unspecified: Secondary | ICD-10-CM | POA: Diagnosis not present

## 2022-01-24 DIAGNOSIS — I129 Hypertensive chronic kidney disease with stage 1 through stage 4 chronic kidney disease, or unspecified chronic kidney disease: Secondary | ICD-10-CM | POA: Diagnosis not present

## 2022-01-24 DIAGNOSIS — I272 Pulmonary hypertension, unspecified: Secondary | ICD-10-CM | POA: Diagnosis not present

## 2022-01-31 DIAGNOSIS — I129 Hypertensive chronic kidney disease with stage 1 through stage 4 chronic kidney disease, or unspecified chronic kidney disease: Secondary | ICD-10-CM | POA: Diagnosis not present

## 2022-01-31 DIAGNOSIS — R809 Proteinuria, unspecified: Secondary | ICD-10-CM | POA: Diagnosis not present

## 2022-01-31 DIAGNOSIS — N1832 Chronic kidney disease, stage 3b: Secondary | ICD-10-CM | POA: Diagnosis not present

## 2022-01-31 DIAGNOSIS — I272 Pulmonary hypertension, unspecified: Secondary | ICD-10-CM | POA: Diagnosis not present

## 2022-02-05 DIAGNOSIS — Z23 Encounter for immunization: Secondary | ICD-10-CM | POA: Diagnosis not present

## 2022-02-07 DIAGNOSIS — Z6833 Body mass index (BMI) 33.0-33.9, adult: Secondary | ICD-10-CM | POA: Diagnosis not present

## 2022-02-07 DIAGNOSIS — G4730 Sleep apnea, unspecified: Secondary | ICD-10-CM | POA: Diagnosis not present

## 2022-02-07 DIAGNOSIS — E6609 Other obesity due to excess calories: Secondary | ICD-10-CM | POA: Diagnosis not present

## 2022-03-06 ENCOUNTER — Encounter: Payer: Self-pay | Admitting: *Deleted

## 2022-06-06 DIAGNOSIS — G4733 Obstructive sleep apnea (adult) (pediatric): Secondary | ICD-10-CM | POA: Diagnosis not present

## 2022-06-11 DIAGNOSIS — G4733 Obstructive sleep apnea (adult) (pediatric): Secondary | ICD-10-CM | POA: Diagnosis not present

## 2022-07-05 DIAGNOSIS — G4733 Obstructive sleep apnea (adult) (pediatric): Secondary | ICD-10-CM | POA: Diagnosis not present

## 2022-07-27 DIAGNOSIS — R809 Proteinuria, unspecified: Secondary | ICD-10-CM | POA: Diagnosis not present

## 2022-07-27 DIAGNOSIS — I129 Hypertensive chronic kidney disease with stage 1 through stage 4 chronic kidney disease, or unspecified chronic kidney disease: Secondary | ICD-10-CM | POA: Diagnosis not present

## 2022-07-27 DIAGNOSIS — N1832 Chronic kidney disease, stage 3b: Secondary | ICD-10-CM | POA: Diagnosis not present

## 2022-07-27 DIAGNOSIS — I272 Pulmonary hypertension, unspecified: Secondary | ICD-10-CM | POA: Diagnosis not present

## 2022-08-05 DIAGNOSIS — G4733 Obstructive sleep apnea (adult) (pediatric): Secondary | ICD-10-CM | POA: Diagnosis not present

## 2022-08-07 DIAGNOSIS — H52203 Unspecified astigmatism, bilateral: Secondary | ICD-10-CM | POA: Diagnosis not present

## 2022-08-07 DIAGNOSIS — H524 Presbyopia: Secondary | ICD-10-CM | POA: Diagnosis not present

## 2022-08-07 DIAGNOSIS — H401132 Primary open-angle glaucoma, bilateral, moderate stage: Secondary | ICD-10-CM | POA: Diagnosis not present

## 2022-08-07 DIAGNOSIS — H25813 Combined forms of age-related cataract, bilateral: Secondary | ICD-10-CM | POA: Diagnosis not present

## 2022-08-20 DIAGNOSIS — G4733 Obstructive sleep apnea (adult) (pediatric): Secondary | ICD-10-CM | POA: Diagnosis not present

## 2022-08-22 DIAGNOSIS — Z0001 Encounter for general adult medical examination with abnormal findings: Secondary | ICD-10-CM | POA: Diagnosis not present

## 2022-08-22 DIAGNOSIS — E782 Mixed hyperlipidemia: Secondary | ICD-10-CM | POA: Diagnosis not present

## 2022-08-22 DIAGNOSIS — N182 Chronic kidney disease, stage 2 (mild): Secondary | ICD-10-CM | POA: Diagnosis not present

## 2022-08-22 DIAGNOSIS — Z6834 Body mass index (BMI) 34.0-34.9, adult: Secondary | ICD-10-CM | POA: Diagnosis not present

## 2022-08-22 DIAGNOSIS — Z8042 Family history of malignant neoplasm of prostate: Secondary | ICD-10-CM | POA: Diagnosis not present

## 2022-08-22 DIAGNOSIS — Z1331 Encounter for screening for depression: Secondary | ICD-10-CM | POA: Diagnosis not present

## 2022-08-22 DIAGNOSIS — E785 Hyperlipidemia, unspecified: Secondary | ICD-10-CM | POA: Diagnosis not present

## 2022-08-22 DIAGNOSIS — E6609 Other obesity due to excess calories: Secondary | ICD-10-CM | POA: Diagnosis not present

## 2022-08-22 DIAGNOSIS — R7309 Other abnormal glucose: Secondary | ICD-10-CM | POA: Diagnosis not present

## 2022-08-22 DIAGNOSIS — I10 Essential (primary) hypertension: Secondary | ICD-10-CM | POA: Diagnosis not present

## 2022-09-04 DIAGNOSIS — G4733 Obstructive sleep apnea (adult) (pediatric): Secondary | ICD-10-CM | POA: Diagnosis not present

## 2022-10-05 DIAGNOSIS — G4733 Obstructive sleep apnea (adult) (pediatric): Secondary | ICD-10-CM | POA: Diagnosis not present

## 2022-10-09 ENCOUNTER — Ambulatory Visit: Payer: Medicare PPO | Admitting: Urology

## 2022-10-09 ENCOUNTER — Encounter: Payer: Self-pay | Admitting: Urology

## 2022-10-09 VITALS — BP 126/70 | HR 58

## 2022-10-09 DIAGNOSIS — R972 Elevated prostate specific antigen [PSA]: Secondary | ICD-10-CM | POA: Diagnosis not present

## 2022-10-09 DIAGNOSIS — N400 Enlarged prostate without lower urinary tract symptoms: Secondary | ICD-10-CM

## 2022-10-09 DIAGNOSIS — N5201 Erectile dysfunction due to arterial insufficiency: Secondary | ICD-10-CM

## 2022-10-09 LAB — URINALYSIS, ROUTINE W REFLEX MICROSCOPIC
Bilirubin, UA: NEGATIVE
Glucose, UA: NEGATIVE
Ketones, UA: NEGATIVE
Nitrite, UA: NEGATIVE
Protein,UA: NEGATIVE
RBC, UA: NEGATIVE
Specific Gravity, UA: 1.02 (ref 1.005–1.030)
Urobilinogen, Ur: 1 mg/dL (ref 0.2–1.0)
pH, UA: 6 (ref 5.0–7.5)

## 2022-10-09 LAB — MICROSCOPIC EXAMINATION: Bacteria, UA: NONE SEEN

## 2022-10-09 MED ORDER — SILDENAFIL CITRATE 100 MG PO TABS: ORAL_TABLET | ORAL | 99 refills | Status: DC

## 2022-10-09 NOTE — Progress Notes (Signed)
History of Present Illness:   5.23.2023: Initially seen for E/M  of an elevated PSA.  PSA checked on 08/18/2021 returned 5.4.  I have seen him in the past, most recently in 2020.  That was for slight elevation in his PSA as well.  His highest PSA in the past was just under 4.  Prostate exam at that time c/w mild BPH (40 mL estimated)  Recheck of his PSA at that time was 4.3.  6.4.2024: No urinary complaints.  Still using sildenafil effectively.  No real urinary tract symptomatology.  No recent PSA.    Past Medical History:  Diagnosis Date   Hypertension    Medical history non-contributory    Sleep apnea     Past Surgical History:  Procedure Laterality Date   COLONOSCOPY  07/2003   friable anal canal.   COLONOSCOPY N/A 11/19/2013   Procedure: COLONOSCOPY;  Surgeon: Corbin Ade, MD;  [facility];  Service: Endoscopy;  Laterality: N/A;  10:30-rescheduled to [address] notified pt   COLONOSCOPY WITH PROPOFOL N/A 08/14/2021   Procedure: COLONOSCOPY WITH PROPOFOL;  Surgeon: Corbin Ade, MD;  [facility];  Service: Endoscopy;  Laterality: N/A;  8:30 / ASA 2   HEMOSTASIS CLIP PLACEMENT  08/14/2021   Procedure: HEMOSTASIS CLIP PLACEMENT;  Surgeon: Corbin Ade, MD;  [facility];  Service: Endoscopy;;   POLYPECTOMY  08/14/2021   Procedure: POLYPECTOMY;  Surgeon: Corbin Ade, MD;  [facility];  Service: Endoscopy;;   SHOULDER ARTHROSCOPY WITH OPEN ROTATOR CUFF REPAIR Right     Home Medications:  Allergies as of 10/09/2022   No Known Allergies      Medication List        Accurate as of October 09, 2022  8:08 AM. If you have any questions, ask your nurse or doctor.          amLODipine 5 MG tablet Commonly known as: NORVASC Take 5 mg by mouth.   chlorthalidone 25 MG tablet Commonly known as: HYGROTON Take 12.5 mg by mouth every morning.   Flax Seed Oil 1300 MG Caps Take 1,300 mg by mouth in the morning and at  bedtime.   latanoprost 0.005 % ophthalmic solution Commonly known as: XALATAN Place 1 drop into both eyes at bedtime.   losartan 25 MG tablet Commonly known as: COZAAR Take 25 mg by mouth daily.   multivitamin with minerals Tabs tablet Take 1 tablet by mouth daily.   polyvinyl alcohol 1.4 % ophthalmic solution Commonly known as: LIQUIFILM TEARS Place 1 drop into both eyes as needed for dry eyes.   rosuvastatin 10 MG tablet Commonly known as: CRESTOR Take by mouth.   sildenafil 100 MG tablet Commonly known as: VIAGRA 1/2-1 tab po prn   sildenafil 20 MG tablet Commonly known as: REVATIO Take 20-100 mg by mouth daily as needed (erectile dysfunction).   VITAMIN D3 PO Take 1 tablet by mouth daily.        Allergies: No Known Allergies  Family History  Problem Relation Age of Onset   Colon polyps Mother    Prostate cancer Father    Lung cancer Brother    Colon cancer Neg Hx     Social History:  reports that he has never smoked. He has never used smokeless tobacco. He reports that he does not drink alcohol and does not use drugs.  ROS: A complete review of systems was performed.  All systems are negative  except for pertinent findings as noted.  Physical Exam:  Vital signs in last 24 hours: There were no vitals taken for this visit. Constitutional:  Alert and oriented, No acute distress Cardiovascular: Regular rate  Respiratory: Normal respiratory effort Neurologic: Grossly intact, no focal deficits Psychiatric: Normal mood and affect  I have reviewed prior pt notes  I have reviewed notes from referring/previous physicians  I have reviewed urinalysis results  I have reviewed prior PSA results  I have reviewed recent blood chemistries and CBC   Impression/Assessment:  BPH without significant lower urinary tract symptoms  Elevated PSA, fairly stable trend with benign exam  Plan:  I will check his PSA today  Unless significant change, I will see back  in 1 year  Approximately 17 minutes

## 2022-10-10 LAB — PSA: Prostate Specific Ag, Serum: 4.9 ng/mL — ABNORMAL HIGH (ref 0.0–4.0)

## 2022-10-16 ENCOUNTER — Telehealth: Payer: Self-pay

## 2022-10-16 DIAGNOSIS — R972 Elevated prostate specific antigen [PSA]: Secondary | ICD-10-CM

## 2022-10-16 NOTE — Telephone Encounter (Signed)
-----   Message from Marcine Matar, MD sent at 10/16/2022  3:18 PM EDT ----- Please call pt--PSA 4.9--up some. I would recommend TRUS/Bx. If he wants to proceed I will put order in ----- Message ----- From: Troy Sine, CMA Sent: 10/10/2022   8:45 AM EDT To: Marcine Matar, MD  Please review

## 2022-10-16 NOTE — Telephone Encounter (Signed)
Tried calling patient with no answer. Left voiced message for return call. 

## 2022-10-18 ENCOUNTER — Telehealth: Payer: Self-pay

## 2022-10-18 NOTE — Telephone Encounter (Signed)
Tried calling patient with no answer, left voiced message for return call. 

## 2022-10-18 NOTE — Telephone Encounter (Signed)
Patient returned your call. I advised the patient that you were in clinic and you would be able to respond to the request for a call back once you were out of clinic.

## 2022-10-18 NOTE — Telephone Encounter (Signed)
Open in error

## 2022-10-19 MED ORDER — LEVOFLOXACIN 750 MG PO TABS: 750.0000 mg | ORAL_TABLET | Freq: Once | ORAL | 0 refills | Status: AC

## 2022-10-19 NOTE — Addendum Note (Signed)
Addended by: Christoper Fabian R on: 10/19/2022 01:09 PM   Modules accepted: Orders

## 2022-10-19 NOTE — Telephone Encounter (Addendum)
Patient is made aware of Dr. Retta Diones recommendation and voiced understanding. Instruction given to patient in office on 10/19/22 and printed.

## 2022-10-22 ENCOUNTER — Telehealth: Payer: Self-pay

## 2022-10-22 NOTE — Telephone Encounter (Signed)
Return call to patient making him aware that his prostate biopsy is scheduled at [facility]. Patient voiced understanding

## 2022-10-23 ENCOUNTER — Other Ambulatory Visit: Payer: Medicare PPO | Admitting: Urology

## 2022-10-23 DIAGNOSIS — G4733 Obstructive sleep apnea (adult) (pediatric): Secondary | ICD-10-CM | POA: Diagnosis not present

## 2022-10-29 ENCOUNTER — Telehealth: Payer: Self-pay

## 2022-10-29 NOTE — Progress Notes (Signed)
This 72 yo male is here for TRUS/Bx for elevating PSA trend.  Risks, benefits, and some of the potential complications of a transrectal ultrasounds of the prostate (TRUSP) with biopsies were discussed at length with the patient including gross hematuria, blood in the bowel movements, hematospermia, bacteremia, infection, voiding discomfort, urinary retention, fever, chills, sepsis, blood transfusion, death, and others. All questions were answered. Informed consent was obtained. The patient confirmed that he had taken his pre-procedure antibiotic. All anticoagulants were discontinued prior to the procedure. The patient emptied his bladder. He was positioned in a comfortable left lateral decubitus position with hips and knees acutely flexed.  The rectal probe was inserted into the rectum without difficulty. 10cc of 2% Lidocaine without epinephrine was instilled with a spinal needle using ultrasound guidance near the junction of each seminal vesicle and the prostate.  Sequential transverse (axial) scans were made in small increments beginning at the seminal vesicles and ending at the prostatic apex. Sequential longitudinal (saggital) scans were made in small increments beginning at the right lateral prostate and ending at the left lateral prostate. Excellent anatomical imaging was obtained. The peripheral, transitional, and central zones were well-defined. The seminal vesicles were normal.  Prostate volume 62 ml.  There were no hypoechoic areas. 12 needle core biopsies were performed. 1 biopsy each was taken from the following areas:  Right lateral base, right medial base, right lateral mid prostate, right medial mid prostate, right lateral apical prostate, right medial apical prostate, left lateral base, left medial base, left lateral mid prostate, left medial mid prostate, left lateral apical prostate, left medial apical prostate.. Minimal prostatic calcifications were noted. Excellent biopsy specimens  were obtained.  Follow-up rectal examination was unremarkable. The procedure was well-tolerated and without complications. Antibiotic instructions were given. The patient was told that:  For several days:  he should increase his fluid intake and limit strenuous activity  he might have mild discomfort at the base of his penis or in his rectum  he might have blood in his urine or blood in his bowel movements  For 2-3 months:  he might have blood in his ejaculate (semen)  Instructions were given to call the office immedicately for blood clots in the urine or bowel movements, difficulty urinating, inability to urinate, urinary retention, painful or frequent urination, fever, chills, nausea, vomiting, or other illness. The patient stated that he understood these instructions and would comply with them. We told the patient that prostate biopsy pathology reports are usually available within 3-5 working days, unless a pathologic second opinion is required, which may take 7-14 days. We told him to contact us to check on the status of his biopsy if he has not heard from [place] within 7 days. The patient left the ultrasound examination room in stable condition.

## 2022-10-29 NOTE — Telephone Encounter (Signed)
Return call to patient. Patient states he had a colostomy last year and there were polyps that were remove. Patient states there is a clip that was supposed to be remove through MRI. Patient is aware that I will ask the the provider here if that will interfere with his prostate biopsy and give him a return call.Return call to patient making him aware that it's safe to proceed with the prostate biopsy. Patient voiced understanding.

## 2022-10-30 ENCOUNTER — Ambulatory Visit (HOSPITAL_BASED_OUTPATIENT_CLINIC_OR_DEPARTMENT_OTHER): Payer: Medicare PPO | Admitting: Urology

## 2022-10-30 ENCOUNTER — Ambulatory Visit (HOSPITAL_COMMUNITY)
Admission: RE | Admit: 2022-10-30 | Discharge: 2022-10-30 | Disposition: A | Discharge status: Home / Self Care | Payer: Medicare PPO | Source: Ambulatory Visit | Attending: Urology | Admitting: Urology

## 2022-10-30 ENCOUNTER — Other Ambulatory Visit: Payer: Self-pay | Admitting: Urology

## 2022-10-30 ENCOUNTER — Encounter (HOSPITAL_COMMUNITY): Payer: Self-pay

## 2022-10-30 VITALS — BP 129/65 | HR 62 | Temp 98.0°F | Resp 16

## 2022-10-30 DIAGNOSIS — N411 Chronic prostatitis: Secondary | ICD-10-CM | POA: Diagnosis not present

## 2022-10-30 DIAGNOSIS — R972 Elevated prostate specific antigen [PSA]: Secondary | ICD-10-CM | POA: Insufficient documentation

## 2022-10-30 DIAGNOSIS — N410 Acute prostatitis: Secondary | ICD-10-CM | POA: Diagnosis not present

## 2022-10-30 DIAGNOSIS — N4289 Other specified disorders of prostate: Secondary | ICD-10-CM | POA: Diagnosis not present

## 2022-10-30 MED ORDER — LIDOCAINE HCL (PF) 2 % IJ SOLN
INTRAMUSCULAR | Status: AC
  Filled 2022-10-30: qty 10

## 2022-10-30 MED ORDER — GENTAMICIN SULFATE 40 MG/ML IJ SOLN
INTRAMUSCULAR | Status: AC
  Filled 2022-10-30: qty 4

## 2022-10-30 MED ORDER — GENTAMICIN SULFATE 40 MG/ML IJ SOLN
160.0000 mg | Freq: Once | INTRAMUSCULAR | Status: AC
  Administered 2022-10-30: 160 mg via INTRAMUSCULAR

## 2022-10-30 MED ORDER — LIDOCAINE HCL (PF) 2 % IJ SOLN
10.0000 mL | Freq: Once | INTRAMUSCULAR | Status: AC
  Administered 2022-10-30: 10 mL

## 2022-10-30 NOTE — Progress Notes (Signed)
PT tolerated prostate biopsy procedure and antibiotic injection well today. Labs obtained and sent for pathology by Richard from ultrasound at [phone]. PT ambulatory at discharge with no acute distress noted and verbalized understanding of discharge instructions.

## 2022-11-04 DIAGNOSIS — G4733 Obstructive sleep apnea (adult) (pediatric): Secondary | ICD-10-CM | POA: Diagnosis not present

## 2022-11-05 ENCOUNTER — Telehealth: Payer: Self-pay

## 2022-11-05 NOTE — Telephone Encounter (Signed)
Patient is made aware of Dr. Retta Diones recommendation and voiced understanding.

## 2022-11-05 NOTE — Telephone Encounter (Signed)
-----   Message from Marcine Matar, MD sent at 11/02/2022  6:38 AM EDT ----- Please call pt--good news, all cores taken on biopsy were benign. I would like for him to come back in 1 year w/PSA for recheck ----- Message ----- From: Troy Sine, CMA Sent: 10/31/2022   4:20 PM EDT To: Marcine Matar, MD  Please review

## 2022-11-27 ENCOUNTER — Other Ambulatory Visit (INDEPENDENT_AMBULATORY_CARE_PROVIDER_SITE_OTHER): Payer: Medicare PPO

## 2022-11-27 ENCOUNTER — Encounter: Payer: Self-pay | Admitting: Orthopaedic Surgery

## 2022-11-27 ENCOUNTER — Ambulatory Visit: Payer: Medicare PPO | Admitting: Orthopaedic Surgery

## 2022-11-27 VITALS — Ht 66.0 in | Wt 210.0 lb

## 2022-11-27 DIAGNOSIS — G8929 Other chronic pain: Secondary | ICD-10-CM | POA: Diagnosis not present

## 2022-11-27 DIAGNOSIS — M25562 Pain in left knee: Secondary | ICD-10-CM

## 2022-11-27 NOTE — Progress Notes (Signed)
The patient is an incredibly active and young appearing 72 year old gentleman has been having left knee pain since May of this year.  He did injure his knee playing softball.  He has been working at home and was doing leg extensions as well and has had some increased pain since then but now his knee seems to be subsiding.  He points to the lateral compartment of his knee as far as the knee joint and the quad tendon as a source of his pain.  Again it has been decreasing.  He was having some swelling before but he is not having any swelling now.  He has not injured the knee before and has not had any type of injections in the knee.  On examination of his left knee there is no effusion today.  His extensor mechanism is intact.  Is 5 out of 5 strength of his quads and hamstrings.  His Lachman's and McMurray's exams are negative.  When he does stand there is a slight deficit in his mid quad on the left side but that is chronic.  There is no deficits in terms of the muscle.  2 views left knee show well-maintained medial lateral compartments in neutral alignment.  There is narrowing of the patellofemoral joint.  He is can slowly get back to sports from my standpoint since he is doing well.  However if he does develop locking and catching in his knee and it seems like he is going backwards, he will let us know because my next step will be obtaining an MRI of his left knee to rule out a lateral meniscal tear.  All questions and concerns were answered and addressed.

## 2022-12-05 DIAGNOSIS — G4733 Obstructive sleep apnea (adult) (pediatric): Secondary | ICD-10-CM | POA: Diagnosis not present

## 2022-12-20 DIAGNOSIS — I10 Essential (primary) hypertension: Secondary | ICD-10-CM | POA: Diagnosis not present

## 2022-12-20 DIAGNOSIS — E785 Hyperlipidemia, unspecified: Secondary | ICD-10-CM | POA: Diagnosis not present

## 2023-01-23 DIAGNOSIS — G4733 Obstructive sleep apnea (adult) (pediatric): Secondary | ICD-10-CM | POA: Diagnosis not present

## 2023-02-19 DIAGNOSIS — Z23 Encounter for immunization: Secondary | ICD-10-CM | POA: Diagnosis not present

## 2023-03-21 DIAGNOSIS — H409 Unspecified glaucoma: Secondary | ICD-10-CM | POA: Diagnosis not present

## 2023-03-21 DIAGNOSIS — N529 Male erectile dysfunction, unspecified: Secondary | ICD-10-CM | POA: Diagnosis not present

## 2023-03-21 DIAGNOSIS — N1832 Chronic kidney disease, stage 3b: Secondary | ICD-10-CM | POA: Diagnosis not present

## 2023-03-21 DIAGNOSIS — Z809 Family history of malignant neoplasm, unspecified: Secondary | ICD-10-CM | POA: Diagnosis not present

## 2023-03-21 DIAGNOSIS — Z8249 Family history of ischemic heart disease and other diseases of the circulatory system: Secondary | ICD-10-CM | POA: Diagnosis not present

## 2023-03-21 DIAGNOSIS — I129 Hypertensive chronic kidney disease with stage 1 through stage 4 chronic kidney disease, or unspecified chronic kidney disease: Secondary | ICD-10-CM | POA: Diagnosis not present

## 2023-03-21 DIAGNOSIS — E669 Obesity, unspecified: Secondary | ICD-10-CM | POA: Diagnosis not present

## 2023-03-21 DIAGNOSIS — M1990 Unspecified osteoarthritis, unspecified site: Secondary | ICD-10-CM | POA: Diagnosis not present

## 2023-03-21 DIAGNOSIS — E785 Hyperlipidemia, unspecified: Secondary | ICD-10-CM | POA: Diagnosis not present

## 2023-04-23 DIAGNOSIS — G4733 Obstructive sleep apnea (adult) (pediatric): Secondary | ICD-10-CM | POA: Diagnosis not present

## 2023-07-03 DIAGNOSIS — H401132 Primary open-angle glaucoma, bilateral, moderate stage: Secondary | ICD-10-CM | POA: Diagnosis not present

## 2023-07-24 DIAGNOSIS — G4733 Obstructive sleep apnea (adult) (pediatric): Secondary | ICD-10-CM | POA: Diagnosis not present

## 2023-08-12 DIAGNOSIS — D631 Anemia in chronic kidney disease: Secondary | ICD-10-CM | POA: Diagnosis not present

## 2023-08-12 DIAGNOSIS — R809 Proteinuria, unspecified: Secondary | ICD-10-CM | POA: Diagnosis not present

## 2023-08-12 DIAGNOSIS — N189 Chronic kidney disease, unspecified: Secondary | ICD-10-CM | POA: Diagnosis not present

## 2023-08-21 DIAGNOSIS — N1832 Chronic kidney disease, stage 3b: Secondary | ICD-10-CM | POA: Diagnosis not present

## 2023-08-21 DIAGNOSIS — I272 Pulmonary hypertension, unspecified: Secondary | ICD-10-CM | POA: Diagnosis not present

## 2023-08-21 DIAGNOSIS — R809 Proteinuria, unspecified: Secondary | ICD-10-CM | POA: Diagnosis not present

## 2023-08-21 DIAGNOSIS — I129 Hypertensive chronic kidney disease with stage 1 through stage 4 chronic kidney disease, or unspecified chronic kidney disease: Secondary | ICD-10-CM | POA: Diagnosis not present

## 2023-09-02 DIAGNOSIS — N182 Chronic kidney disease, stage 2 (mild): Secondary | ICD-10-CM | POA: Diagnosis not present

## 2023-09-02 DIAGNOSIS — Z0001 Encounter for general adult medical examination with abnormal findings: Secondary | ICD-10-CM | POA: Diagnosis not present

## 2023-09-02 DIAGNOSIS — E6609 Other obesity due to excess calories: Secondary | ICD-10-CM | POA: Diagnosis not present

## 2023-09-02 DIAGNOSIS — G4730 Sleep apnea, unspecified: Secondary | ICD-10-CM | POA: Diagnosis not present

## 2023-09-02 DIAGNOSIS — I10 Essential (primary) hypertension: Secondary | ICD-10-CM | POA: Diagnosis not present

## 2023-09-02 DIAGNOSIS — R739 Hyperglycemia, unspecified: Secondary | ICD-10-CM | POA: Diagnosis not present

## 2023-09-02 DIAGNOSIS — N401 Enlarged prostate with lower urinary tract symptoms: Secondary | ICD-10-CM | POA: Diagnosis not present

## 2023-09-02 DIAGNOSIS — E782 Mixed hyperlipidemia: Secondary | ICD-10-CM | POA: Diagnosis not present

## 2023-09-02 DIAGNOSIS — E785 Hyperlipidemia, unspecified: Secondary | ICD-10-CM | POA: Diagnosis not present

## 2023-09-02 DIAGNOSIS — R7309 Other abnormal glucose: Secondary | ICD-10-CM | POA: Diagnosis not present

## 2023-09-04 ENCOUNTER — Encounter: Payer: Self-pay | Admitting: Podiatry

## 2023-09-04 ENCOUNTER — Other Ambulatory Visit: Payer: Self-pay | Admitting: Podiatry

## 2023-09-04 ENCOUNTER — Ambulatory Visit (INDEPENDENT_AMBULATORY_CARE_PROVIDER_SITE_OTHER)

## 2023-09-04 ENCOUNTER — Ambulatory Visit: Admitting: Podiatry

## 2023-09-04 DIAGNOSIS — M79675 Pain in left toe(s): Secondary | ICD-10-CM

## 2023-09-04 DIAGNOSIS — M216X2 Other acquired deformities of left foot: Secondary | ICD-10-CM

## 2023-09-04 NOTE — Progress Notes (Signed)
 Subjective:  Patient ID: Kevin Rose, male    DOB: 06-Feb-1951,  MRN: [id number]  Chief Complaint  Patient presents with   Nail Problem    RFC    73 y.o. male presents with the above complaint.  Patient presents with left plantarflexed fifth metatarsal painful to touch is progressive and worse worse with ambulation or shoe pressure patient states it is painful he would like to discuss treatment options for this pain scale 7 out of 10 dull aching nature he has tried shoe gear modification padding offloading but has progressively gotten worse.  He would like to discuss surgical options at this time.   Review of Systems: Negative except as noted in the HPI. Denies N/V/F/Ch.  Past Medical History:  Diagnosis Date   Hypertension    Medical history non-contributory    Sleep apnea     Current Outpatient Medications:    amLODipine (NORVASC) 5 MG tablet, Take 5 mg by mouth., Disp: , Rfl:    chlorthalidone (HYGROTON) 25 MG tablet, Take 12.5 mg by mouth every morning., Disp: , Rfl:    Cholecalciferol (VITAMIN D3 PO), Take 1 tablet by mouth daily., Disp: , Rfl:    Flaxseed, Linseed, (FLAX SEED OIL) 1300 MG CAPS, Take 1,300 mg by mouth in the morning and at bedtime., Disp: , Rfl:    latanoprost (XALATAN) 0.005 % ophthalmic solution, Place 1 drop into both eyes at bedtime., Disp: , Rfl:    losartan (COZAAR) 25 MG tablet, Take 25 mg by mouth daily., Disp: , Rfl:    Multiple Vitamin (MULTIVITAMIN WITH MINERALS) TABS tablet, Take 1 tablet by mouth daily., Disp: , Rfl:    polyvinyl alcohol (LIQUIFILM TEARS) 1.4 % ophthalmic solution, Place 1 drop into both eyes as needed for dry eyes., Disp: , Rfl:    rosuvastatin (CRESTOR) 10 MG tablet, Take by mouth., Disp: , Rfl:    sildenafil  (REVATIO ) 20 MG tablet, Take 20-100 mg by mouth daily as needed (erectile dysfunction). , Disp: , Rfl:    sildenafil  (VIAGRA ) 100 MG tablet, 1/2-1 tab po prn, Disp: 30 tablet, Rfl: prn  Social History   Tobacco Use   Smoking Status Never  Smokeless Tobacco Never    No Known Allergies Objective:  There were no vitals filed for this visit. There is no height or weight on file to calculate BMI. Constitutional Well developed. Well nourished.  Vascular Dorsalis pedis pulses palpable bilaterally. Posterior tibial pulses palpable bilaterally. Capillary refill normal to all digits.  No cyanosis or clubbing noted. Pedal hair growth normal.  Neurologic Normal speech. Oriented to person, place, and time. Epicritic sensation to light touch grossly present bilaterally.  Dermatologic Nails well groomed and normal in appearance. No open wounds. No skin lesions.  Orthopedic: Pain on palpation to the left plantarflexed fifth metatarsal pain submetatarsal 5.  Skin lesion noted.  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  Left plantarflexed fifth metatarsal - All questions and concerns were discussed with the patient in extensive detail given the patient has failed all conservative care including shoe gear modification padding protecting offloading patient will benefit from surgical intervention I discussed my preoperative intra postop plan with  the patient in extensive detail.  Patient will benefit from floating osteotomy of the fifth metatarsal.  He agrees with the plan like to proceed with surgery -Informed surgical risk consent was reviewed and read aloud to the patient.  I reviewed the films.  I have discussed my findings with the patient in great detail.  I have discussed all risks including but not limited to infection, stiffness, scarring, limp,  disability, deformity, damage to blood vessels and nerves, numbness, poor healing, need for braces, arthritis, chronic pain, amputation, death.  All benefits and realistic expectations discussed in great detail.  I have made no promises as to the outcome.  I have provided realistic expectations.  I have offered the patient a 2nd opinion, which they have declined and assured me they preferred to proceed despite the risks   No follow-ups on file.

## 2023-10-08 ENCOUNTER — Telehealth: Payer: Self-pay | Admitting: Podiatry

## 2023-10-08 NOTE — Telephone Encounter (Signed)
 Pt left message this morning asking for a call back. I returned call and he received the letter from insurance denying the surgery but has started the appeal process. His case # is [id number] and they told him he should here back within 48 to 72 hours. Surgery is scheduled for 6/23. It looks like Kevin Rose's not staying needs peer to peer.

## 2023-10-15 NOTE — Telephone Encounter (Signed)
 Emailed cohere to get a peer to peer scheduled.

## 2023-10-28 ENCOUNTER — Other Ambulatory Visit: Payer: Self-pay | Admitting: Podiatry

## 2023-10-28 MED ORDER — IBUPROFEN 800 MG PO TABS
800.0000 mg | ORAL_TABLET | Freq: Four times a day (QID) | ORAL | 1 refills | Status: DC | PRN
Start: 2023-10-28 — End: 2024-01-16

## 2023-10-28 MED ORDER — OXYCODONE-ACETAMINOPHEN 5-325 MG PO TABS: 1.0000 | ORAL_TABLET | ORAL | 0 refills | Status: DC | PRN

## 2023-10-31 DIAGNOSIS — H401132 Primary open-angle glaucoma, bilateral, moderate stage: Secondary | ICD-10-CM | POA: Diagnosis not present

## 2023-11-06 ENCOUNTER — Other Ambulatory Visit: Payer: Medicare PPO

## 2023-11-06 ENCOUNTER — Other Ambulatory Visit: Payer: Self-pay

## 2023-11-06 ENCOUNTER — Encounter: Admitting: Podiatry

## 2023-11-06 DIAGNOSIS — R972 Elevated prostate specific antigen [PSA]: Secondary | ICD-10-CM | POA: Diagnosis not present

## 2023-11-07 LAB — PSA: Prostate Specific Ag, Serum: 4.4 ng/mL — ABNORMAL HIGH (ref 0.0–4.0)

## 2023-11-11 ENCOUNTER — Other Ambulatory Visit: Payer: Self-pay | Admitting: Podiatry

## 2023-11-11 DIAGNOSIS — M21542 Acquired clubfoot, left foot: Secondary | ICD-10-CM | POA: Diagnosis not present

## 2023-11-11 DIAGNOSIS — M216X2 Other acquired deformities of left foot: Secondary | ICD-10-CM | POA: Diagnosis not present

## 2023-11-11 MED ORDER — IBUPROFEN 800 MG PO TABS
800.0000 mg | ORAL_TABLET | Freq: Four times a day (QID) | ORAL | 1 refills | Status: DC | PRN
Start: 2023-11-11 — End: 2024-01-16

## 2023-11-11 MED ORDER — OXYCODONE-ACETAMINOPHEN 5-325 MG PO TABS: 1.0000 | ORAL_TABLET | ORAL | 0 refills | Status: DC | PRN

## 2023-11-11 NOTE — Progress Notes (Signed)
 History of Present Illness: Kevin Rose returns for f/u of elevated PSA. He underwent TRUS/Bx on 6.25.2024. At that time prostate volume was 62 mL, PSA 4.4, PSAD 0.07. All 12 cores were benign.  Past Medical History:  Diagnosis Date   Hypertension    Medical history non-contributory    Sleep apnea     Past Surgical History:  Procedure Laterality Date   COLONOSCOPY  07/2003   friable anal canal.   COLONOSCOPY N/A 11/19/2013   Procedure: COLONOSCOPY;  Surgeon: Lamar CHRISTELLA Hollingshead, MD;  [facility]: [facility];  Service: Endoscopy;  Laterality: N/A;  10:30-rescheduled to 1245 [address] notified pt   COLONOSCOPY WITH PROPOFOL  N/A 08/14/2021   Procedure: COLONOSCOPY WITH PROPOFOL ;  Surgeon: Hollingshead Lamar CHRISTELLA, MD;  [facility]: [facility];  Service: Endoscopy;  Laterality: N/A;  8:30 / ASA 2   HEMOSTASIS CLIP PLACEMENT  08/14/2021   Procedure: HEMOSTASIS CLIP PLACEMENT;  Surgeon: Hollingshead Lamar CHRISTELLA, MD;  [facility]: [facility];  Service: Endoscopy;;   POLYPECTOMY  08/14/2021   Procedure: POLYPECTOMY;  Surgeon: Hollingshead Lamar CHRISTELLA, MD;  [facility];  Service: Endoscopy;;   SHOULDER ARTHROSCOPY WITH OPEN ROTATOR CUFF REPAIR Right     Home Medications:  Allergies as of 11/12/2023   No Known Allergies      Medication List        Accurate as of November 11, 2023  6:46 AM. If you have any questions, ask your nurse or doctor.          amLODipine 5 MG tablet Commonly known as: NORVASC Take 5 mg by mouth.   artificial tears ophthalmic solution Place 1 drop into both eyes as needed for dry eyes.   chlorthalidone 25 MG tablet Commonly known as: HYGROTON Take 12.5 mg by mouth every morning.   Flax Seed Oil 1300 MG Caps Take 1,300 mg by mouth in the morning and at bedtime.   ibuprofen  800 MG tablet Commonly known as: ADVIL  Take 1 tablet (800 mg total) by mouth every 6 (six) hours as needed.   latanoprost 0.005 % ophthalmic solution Commonly known as: XALATAN Place 1 drop into both eyes at  bedtime.   losartan 25 MG tablet Commonly known as: COZAAR Take 25 mg by mouth daily.   multivitamin with minerals Tabs tablet Take 1 tablet by mouth daily.   oxyCODONE -acetaminophen  5-325 MG tablet Commonly known as: Percocet Take 1 tablet by mouth every 4 (four) hours as needed for severe pain (pain score 7-10).   rosuvastatin 10 MG tablet Commonly known as: CRESTOR Take by mouth.   sildenafil  100 MG tablet Commonly known as: VIAGRA  1/2-1 tab po prn   sildenafil  20 MG tablet Commonly known as: REVATIO  Take 20-100 mg by mouth daily as needed (erectile dysfunction).   VITAMIN D3 PO Take 1 tablet by mouth daily.        Allergies: No Known Allergies  Family History  Problem Relation Age of Onset   Colon polyps Mother    Prostate cancer Father    Lung cancer Brother    Colon cancer Neg Hx     Social History:  reports that he has never smoked. He has never used smokeless tobacco. He reports that he does not drink alcohol and does not use drugs.  ROS: A complete review of systems was performed.  All systems are negative except for pertinent findings as noted.  Physical Exam:  Vital signs in last 24 hours: There were no vitals taken for this visit.  Constitutional:  Alert and oriented, No acute distress Cardiovascular: Regular rate  Respiratory: Normal respiratory effort GI: Abdomen is soft, nontender, nondistended, no abdominal masses. No CVAT.  Genitourinary: Normal male phallus, testes are descended bilaterally and non-tender and without masses, scrotum is normal in appearance without lesions or masses, perineum is normal on inspection. Lymphatic: No lymphadenopathy Neurologic: Grossly intact, no focal deficits Psychiatric: Normal mood and affect  I have reviewed prior pt notes  I have reviewed urinalysis results  I have independently reviewed prior imaging--prostate U/S  I have reviewed prior PSA and pathology results   Impression/Assessment:   ***  Plan:  ***

## 2023-11-12 ENCOUNTER — Ambulatory Visit: Payer: Medicare PPO | Admitting: Urology

## 2023-11-12 VITALS — BP 132/71 | HR 66

## 2023-11-12 DIAGNOSIS — R972 Elevated prostate specific antigen [PSA]: Secondary | ICD-10-CM | POA: Diagnosis not present

## 2023-11-12 DIAGNOSIS — Z87898 Personal history of other specified conditions: Secondary | ICD-10-CM

## 2023-11-12 DIAGNOSIS — Z09 Encounter for follow-up examination after completed treatment for conditions other than malignant neoplasm: Secondary | ICD-10-CM | POA: Diagnosis not present

## 2023-11-12 DIAGNOSIS — N5201 Erectile dysfunction due to arterial insufficiency: Secondary | ICD-10-CM

## 2023-11-12 LAB — URINALYSIS, ROUTINE W REFLEX MICROSCOPIC
Bilirubin, UA: NEGATIVE
Glucose, UA: NEGATIVE
Ketones, UA: NEGATIVE
Nitrite, UA: NEGATIVE
Protein,UA: NEGATIVE
RBC, UA: NEGATIVE
Specific Gravity, UA: 1.025 (ref 1.005–1.030)
Urobilinogen, Ur: 0.2 mg/dL (ref 0.2–1.0)
pH, UA: 6 (ref 5.0–7.5)

## 2023-11-12 LAB — MICROSCOPIC EXAMINATION
Bacteria, UA: NONE SEEN
RBC, Urine: NONE SEEN /HPF (ref 0–2)

## 2023-11-12 MED ORDER — SILDENAFIL CITRATE 100 MG PO TABS: ORAL_TABLET | ORAL | 99 refills | Status: AC

## 2023-11-20 ENCOUNTER — Ambulatory Visit: Admitting: Podiatry

## 2023-11-20 ENCOUNTER — Ambulatory Visit (INDEPENDENT_AMBULATORY_CARE_PROVIDER_SITE_OTHER)

## 2023-11-20 DIAGNOSIS — M216X2 Other acquired deformities of left foot: Secondary | ICD-10-CM | POA: Diagnosis not present

## 2023-11-20 DIAGNOSIS — Z9889 Other specified postprocedural states: Secondary | ICD-10-CM

## 2023-11-20 NOTE — Progress Notes (Signed)
 Subjective:  Patient ID: Kevin Rose, male    DOB: 1951-03-03,  MRN: [id number]  Chief Complaint  Patient presents with   Routine Post Op    POV # 1 DOS 11/11/23 LEFT 5TH FLOATING OSTEOTOMY Pt stated that he is doing well no concerns at this time he denies any pain fever chills nausea or vomiting     DOS: 11/11/2023 Procedure: Left fifth floating osteotomy  73 y.o. male returns for post-op check.  He states that he is doing well denies any other acute complaints.  No nausea fever chills vomiting.  Review of Systems: Negative except as noted in the HPI. Denies N/V/F/Ch.  Past Medical History:  Diagnosis Date   Hypertension    Medical history non-contributory    Sleep apnea     Current Outpatient Medications:    amLODipine (NORVASC) 5 MG tablet, Take 5 mg by mouth., Disp: , Rfl:    chlorthalidone (HYGROTON) 25 MG tablet, Take 12.5 mg by mouth every morning., Disp: , Rfl:    Cholecalciferol (VITAMIN D3 PO), Take 1 tablet by mouth daily., Disp: , Rfl:    Flaxseed, Linseed, (FLAX SEED OIL) 1300 MG CAPS, Take 1,300 mg by mouth in the morning and at bedtime., Disp: , Rfl:    ibuprofen  (ADVIL ) 800 MG tablet, Take 1 tablet (800 mg total) by mouth every 6 (six) hours as needed., Disp: 60 tablet, Rfl: 1   ibuprofen  (ADVIL ) 800 MG tablet, Take 1 tablet (800 mg total) by mouth every 6 (six) hours as needed., Disp: 60 tablet, Rfl: 1   latanoprost (XALATAN) 0.005 % ophthalmic solution, Place 1 drop into both eyes at bedtime., Disp: , Rfl:    losartan (COZAAR) 25 MG tablet, Take 25 mg by mouth daily., Disp: , Rfl:    Multiple Vitamin (MULTIVITAMIN WITH MINERALS) TABS tablet, Take 1 tablet by mouth daily., Disp: , Rfl:    oxyCODONE -acetaminophen  (PERCOCET) 5-325 MG tablet, Take 1 tablet by mouth every 4 (four) hours as needed for severe pain (pain score 7-10)., Disp: 30 tablet, Rfl: 0   oxyCODONE -acetaminophen  (PERCOCET) 5-325 MG tablet, Take 1 tablet by mouth every 4 (four) hours as needed for  severe pain (pain score 7-10)., Disp: 30 tablet, Rfl: 0   polyvinyl alcohol (LIQUIFILM TEARS) 1.4 % ophthalmic solution, Place 1 drop into both eyes as needed for dry eyes., Disp: , Rfl:    rosuvastatin (CRESTOR) 10 MG tablet, Take by mouth., Disp: , Rfl:    sildenafil  (REVATIO ) 20 MG tablet, Take 20-100 mg by mouth daily as needed (erectile dysfunction). , Disp: , Rfl:    sildenafil  (VIAGRA ) 100 MG tablet, 1/2-1 tab po prn, Disp: 30 tablet, Rfl: prn  Social History   Tobacco Use  Smoking Status Never  Smokeless Tobacco Never    No Known Allergies Objective:  There were no vitals filed for this visit. There is no height or weight on file to calculate BMI. Constitutional Well developed. Well nourished.  Vascular Foot warm and well perfused. Capillary refill normal to all digits.   Neurologic Normal speech. Oriented to person, place, and time. Epicritic sensation to light touch grossly present bilaterally.  Dermatologic Skin healing well without signs of infection. Skin edges well coapted without signs of infection.  Orthopedic: Tenderness to palpation noted about the surgical site.   Radiographs: 3 views of skeletally mature adult left foot: Osteotomy of the fifth metatarsal noted.  Good correction alignment noted reduction of deformity noted. Assessment:   1. Plantar flexed metatarsal bone  of left foot   2. S/P foot surgery    Plan:  Patient was evaluated and treated and all questions answered.  S/p foot surgery left -Progressing as expected post-operatively. -XR: See above -WB Status: Weightbearing as tolerated in surgical shoe -Sutures: Intact.  No clinical signs of dehiscence noted no complication noted. -Medications: None -Foot redressed.  No follow-ups on file.

## 2023-11-26 DIAGNOSIS — G4733 Obstructive sleep apnea (adult) (pediatric): Secondary | ICD-10-CM | POA: Diagnosis not present

## 2023-12-04 ENCOUNTER — Ambulatory Visit: Admitting: Podiatry

## 2023-12-06 ENCOUNTER — Ambulatory Visit: Admitting: Podiatry

## 2023-12-06 DIAGNOSIS — Z9889 Other specified postprocedural states: Secondary | ICD-10-CM

## 2023-12-06 DIAGNOSIS — M216X2 Other acquired deformities of left foot: Secondary | ICD-10-CM

## 2023-12-06 NOTE — Progress Notes (Signed)
 Subjective:  Patient ID: Kevin Rose, male    DOB: [birth year],  MRN: [id number]  Chief Complaint  Patient presents with   Routine Post Op    POV # 2 DOS 11/11/23 LEFT 5TH FLOATING OSTEOTOMY/    DOS: 11/11/2023 Procedure: Left fifth floating osteotomy  73 y.o. male returns for post-op check.  He states that he is doing well denies any other acute complaints.  No nausea fever chills vomiting.  Review of Systems: Negative except as noted in the HPI. Denies N/V/F/Ch.  Past Medical History:  Diagnosis Date   Hypertension    Medical history non-contributory    Sleep apnea     Current Outpatient Medications:    amLODipine (NORVASC) 5 MG tablet, Take 5 mg by mouth., Disp: , Rfl:    chlorthalidone (HYGROTON) 25 MG tablet, Take 12.5 mg by mouth every morning., Disp: , Rfl:    Cholecalciferol (VITAMIN D3 PO), Take 1 tablet by mouth daily., Disp: , Rfl:    Flaxseed, Linseed, (FLAX SEED OIL) 1300 MG CAPS, Take 1,300 mg by mouth in the morning and at bedtime., Disp: , Rfl:    ibuprofen  (ADVIL ) 800 MG tablet, Take 1 tablet (800 mg total) by mouth every 6 (six) hours as needed., Disp: 60 tablet, Rfl: 1   ibuprofen  (ADVIL ) 800 MG tablet, Take 1 tablet (800 mg total) by mouth every 6 (six) hours as needed., Disp: 60 tablet, Rfl: 1   latanoprost (XALATAN) 0.005 % ophthalmic solution, Place 1 drop into both eyes at bedtime., Disp: , Rfl:    losartan (COZAAR) 25 MG tablet, Take 25 mg by mouth daily., Disp: , Rfl:    Multiple Vitamin (MULTIVITAMIN WITH MINERALS) TABS tablet, Take 1 tablet by mouth daily., Disp: , Rfl:    oxyCODONE -acetaminophen  (PERCOCET) 5-325 MG tablet, Take 1 tablet by mouth every 4 (four) hours as needed for severe pain (pain score 7-10)., Disp: 30 tablet, Rfl: 0   oxyCODONE -acetaminophen  (PERCOCET) 5-325 MG tablet, Take 1 tablet by mouth every 4 (four) hours as needed for severe pain (pain score 7-10)., Disp: 30 tablet, Rfl: 0   polyvinyl alcohol (LIQUIFILM TEARS) 1.4 %  ophthalmic solution, Place 1 drop into both eyes as needed for dry eyes., Disp: , Rfl:    rosuvastatin (CRESTOR) 10 MG tablet, Take by mouth., Disp: , Rfl:    sildenafil  (REVATIO ) 20 MG tablet, Take 20-100 mg by mouth daily as needed (erectile dysfunction). , Disp: , Rfl:    sildenafil  (VIAGRA ) 100 MG tablet, 1/2-1 tab po prn, Disp: 30 tablet, Rfl: prn  Social History   Tobacco Use  Smoking Status Never  Smokeless Tobacco Never    No Known Allergies Objective:  There were no vitals filed for this visit. There is no height or weight on file to calculate BMI. Constitutional Well developed. Well nourished.  Vascular Foot warm and well perfused. Capillary refill normal to all digits.   Neurologic Normal speech. Oriented to person, place, and time. Epicritic sensation to light touch grossly present bilaterally.  Dermatologic Skin completely epithelialized no signs of dehiscence noted no complication noted.  Orthopedic: No further tenderness to palpation noted about the surgical site.   Radiographs: 3 views of skeletally mature adult left foot: Osteotomy of the fifth metatarsal noted.  Good correction alignment noted reduction of deformity noted. Assessment:   1. Plantar flexed metatarsal bone of left foot   2. S/P foot surgery     Plan:  Patient was evaluated and treated and all questions answered.  S/p foot surgery left - Clinically healed and officially discharged from my care.  Reduction of deformity noted.  If any foot and ankle issues or in the future he will come back and see me.  Return in about 3 months (around 03/07/2024) for RFC .

## 2024-01-16 ENCOUNTER — Other Ambulatory Visit: Payer: Self-pay

## 2024-01-16 ENCOUNTER — Ambulatory Visit: Payer: Self-pay | Admitting: Family Medicine

## 2024-01-16 ENCOUNTER — Other Ambulatory Visit (HOSPITAL_COMMUNITY): Payer: Self-pay

## 2024-01-16 ENCOUNTER — Telehealth: Payer: Self-pay

## 2024-01-16 ENCOUNTER — Encounter: Payer: Self-pay | Admitting: Family Medicine

## 2024-01-16 VITALS — BP 133/74 | HR 67 | Ht 66.0 in | Wt 215.0 lb

## 2024-01-16 DIAGNOSIS — E038 Other specified hypothyroidism: Secondary | ICD-10-CM | POA: Diagnosis not present

## 2024-01-16 DIAGNOSIS — E559 Vitamin D deficiency, unspecified: Secondary | ICD-10-CM

## 2024-01-16 DIAGNOSIS — Z1159 Encounter for screening for other viral diseases: Secondary | ICD-10-CM | POA: Diagnosis not present

## 2024-01-16 DIAGNOSIS — R7301 Impaired fasting glucose: Secondary | ICD-10-CM

## 2024-01-16 DIAGNOSIS — I10 Essential (primary) hypertension: Secondary | ICD-10-CM | POA: Diagnosis not present

## 2024-01-16 DIAGNOSIS — Z0001 Encounter for general adult medical examination with abnormal findings: Secondary | ICD-10-CM

## 2024-01-16 DIAGNOSIS — Z23 Encounter for immunization: Secondary | ICD-10-CM

## 2024-01-16 MED ORDER — ROSUVASTATIN CALCIUM 10 MG PO TABS: 10.0000 mg | ORAL_TABLET | Freq: Every day | ORAL | 0 refills | Status: DC

## 2024-01-16 MED ORDER — AMLODIPINE BESYLATE 5 MG PO TABS: 5.0000 mg | ORAL_TABLET | Freq: Every day | ORAL | 0 refills | Status: DC

## 2024-01-16 MED ORDER — LATANOPROST 0.005 % OP SOLN: 1.0000 [drp] | Freq: Every day | OPHTHALMIC | 0 refills | Status: AC

## 2024-01-16 MED ORDER — CHLORTHALIDONE 25 MG PO TABS: 12.5000 mg | ORAL_TABLET | Freq: Every morning | ORAL | 0 refills | Status: DC

## 2024-01-16 NOTE — Progress Notes (Deleted)
 New Patient Office Visit   Subjective   Patient ID: Kevin Rose, male    DOB: December 15, 1950  Age: 73 y.o. MRN: [id number]  CC:  Chief Complaint  Patient presents with   Establish Care    HPI Kevin Rose presents to establish care. He  has a past medical history of Hypertension, Medical history non-contributory, and Sleep apnea.  HPI  ***  Outpatient Encounter Medications as of 01/16/2024  Medication Sig   amLODipine  (NORVASC ) 5 MG tablet Take 5 mg by mouth.   chlorthalidone  (HYGROTON ) 25 MG tablet Take 12.5 mg by mouth every morning.   Cholecalciferol (VITAMIN D3 PO) Take 1 tablet by mouth daily.   Flaxseed, Linseed, (FLAX SEED OIL) 1300 MG CAPS Take 1,300 mg by mouth in the morning and at bedtime.   latanoprost  (XALATAN ) 0.005 % ophthalmic solution Place 1 drop into both eyes at bedtime.   losartan (COZAAR) 25 MG tablet Take 25 mg by mouth daily.   Multiple Vitamin (MULTIVITAMIN WITH MINERALS) TABS tablet Take 1 tablet by mouth daily.   rosuvastatin  (CRESTOR ) 10 MG tablet Take by mouth.   sildenafil  (VIAGRA ) 100 MG tablet 1/2-1 tab po prn   ibuprofen  (ADVIL ) 800 MG tablet Take 1 tablet (800 mg total) by mouth every 6 (six) hours as needed. (Patient not taking: Reported on 01/16/2024)   ibuprofen  (ADVIL ) 800 MG tablet Take 1 tablet (800 mg total) by mouth every 6 (six) hours as needed. (Patient not taking: Reported on 01/16/2024)   oxyCODONE -acetaminophen  (PERCOCET) 5-325 MG tablet Take 1 tablet by mouth every 4 (four) hours as needed for severe pain (pain score 7-10). (Patient not taking: Reported on 01/16/2024)   oxyCODONE -acetaminophen  (PERCOCET) 5-325 MG tablet Take 1 tablet by mouth every 4 (four) hours as needed for severe pain (pain score 7-10). (Patient not taking: Reported on 01/16/2024)   polyvinyl alcohol (LIQUIFILM TEARS) 1.4 % ophthalmic solution Place 1 drop into both eyes as needed for dry eyes. (Patient not taking: Reported on 01/16/2024)   sildenafil  (REVATIO )  20 MG tablet Take 20-100 mg by mouth daily as needed (erectile dysfunction).  (Patient not taking: Reported on 01/16/2024)   No facility-administered encounter medications on file as of 01/16/2024.    Past Surgical History:  Procedure Laterality Date   COLONOSCOPY  07/2003   friable anal canal.   COLONOSCOPY N/A 11/19/2013   Procedure: COLONOSCOPY;  Surgeon: Lamar CHRISTELLA Hollingshead, MD;  [facility];  Service: Endoscopy;  Laterality: N/A;  10:30-rescheduled to 1245 [address] Ann notified pt   COLONOSCOPY WITH PROPOFOL  N/A 08/14/2021   Procedure: COLONOSCOPY WITH PROPOFOL ;  Surgeon: Hollingshead Lamar CHRISTELLA, MD;  [facility];  Service: Endoscopy;  Laterality: N/A;  8:30 / ASA 2   HEMOSTASIS CLIP PLACEMENT  08/14/2021   Procedure: HEMOSTASIS CLIP PLACEMENT;  Surgeon: Hollingshead Lamar CHRISTELLA, MD;  [facility];  Service: Endoscopy;;   POLYPECTOMY  08/14/2021   Procedure: POLYPECTOMY;  Surgeon: Hollingshead Lamar CHRISTELLA, MD;  [facility];  Service: Endoscopy;;   SHOULDER ARTHROSCOPY WITH OPEN ROTATOR CUFF REPAIR Right     ROS    Objective    BP 133/74   Pulse 67   Ht 5' 6 (1.676 m)   Wt 215 lb (97.5 kg)   SpO2 95%   BMI 34.70 kg/m   Physical Exam    Assessment & Plan:  There are no diagnoses linked to this encounter.  No follow-ups on file.   Hilario Terry Gals  Polanco, FNP

## 2024-01-16 NOTE — Assessment & Plan Note (Signed)

## 2024-01-16 NOTE — Patient Instructions (Signed)

## 2024-01-16 NOTE — Telephone Encounter (Signed)
 Refills sent to pharmacy.

## 2024-01-16 NOTE — Telephone Encounter (Signed)
 Copied from CRM [id number]. Topic: Clinical - Medication Prior Auth >> Jan 16, 2024 11:53 AM Jasmin G wrote: Reason for CRM: Pt's preferred pharmacy, [facility] - [place], [place] - [address] needs prior authorization on amLODipine  (NORVASC ) 5 MG tablet chlorthalidone  (HYGROTON ) 25 MG tablet rosuvastatin  (CRESTOR ) 10 MG tablet losartan (COZAAR) 25 MG tablet latanoprost  (XALATAN ) 0.005 % ophthalmic solution.

## 2024-01-16 NOTE — Telephone Encounter (Signed)
 Patient has [employer] and none of those medications would require a prior authorization. Please route to PCP for review as I believe they may just be requesting a refill authorization.

## 2024-01-16 NOTE — Progress Notes (Signed)
 Complete physical exam  Patient: Kevin Rose   DOB: 1951/04/10   73 y.o. Male  MRN: [id number]  Subjective:    Chief Complaint  Patient presents with   Establish Care    Kevin Rose is a 73 y.o. male who presents today for a complete physical exam. He reports consuming a general diet. Patient plays softball on weekends He generally feels well. He reports sleeping well. He does have additional problems to discuss today.    Most recent fall risk assessment:     No data to display           Most recent depression screenings:    01/16/2024    8:07 AM  PHQ 2/9 Scores  PHQ - 2 Score 0  PHQ- 9 Score 0    Vision:Within last year and Dental: No current dental problems and Receives regular dental care  Patient Care Team: Del Wilhelmena Falter, Hilario, FNP as PCP - General (Family Medicine) Shaaron, Lamar HERO, MD as Consulting Physician (Gastroenterology)   Outpatient Medications Prior to Visit  Medication Sig   amLODipine  (NORVASC ) 5 MG tablet Take 5 mg by mouth.   chlorthalidone  (HYGROTON ) 25 MG tablet Take 12.5 mg by mouth every morning.   Cholecalciferol (VITAMIN D3 PO) Take 1 tablet by mouth daily.   Flaxseed, Linseed, (FLAX SEED OIL) 1300 MG CAPS Take 1,300 mg by mouth in the morning and at bedtime.   latanoprost  (XALATAN ) 0.005 % ophthalmic solution Place 1 drop into both eyes at bedtime.   losartan (COZAAR) 25 MG tablet Take 25 mg by mouth daily.   Multiple Vitamin (MULTIVITAMIN WITH MINERALS) TABS tablet Take 1 tablet by mouth daily.   rosuvastatin  (CRESTOR ) 10 MG tablet Take by mouth.   sildenafil  (VIAGRA ) 100 MG tablet 1/2-1 tab po prn   [DISCONTINUED] ibuprofen  (ADVIL ) 800 MG tablet Take 1 tablet (800 mg total) by mouth every 6 (six) hours as needed. (Patient not taking: Reported on 01/16/2024)   [DISCONTINUED] ibuprofen  (ADVIL ) 800 MG tablet Take 1 tablet (800 mg total) by mouth every 6 (six) hours as needed. (Patient not taking: Reported on 01/16/2024)    [DISCONTINUED] oxyCODONE -acetaminophen  (PERCOCET) 5-325 MG tablet Take 1 tablet by mouth every 4 (four) hours as needed for severe pain (pain score 7-10). (Patient not taking: Reported on 01/16/2024)   [DISCONTINUED] oxyCODONE -acetaminophen  (PERCOCET) 5-325 MG tablet Take 1 tablet by mouth every 4 (four) hours as needed for severe pain (pain score 7-10). (Patient not taking: Reported on 01/16/2024)   [DISCONTINUED] polyvinyl alcohol (LIQUIFILM TEARS) 1.4 % ophthalmic solution Place 1 drop into both eyes as needed for dry eyes. (Patient not taking: Reported on 01/16/2024)   [DISCONTINUED] sildenafil  (REVATIO ) 20 MG tablet Take 20-100 mg by mouth daily as needed (erectile dysfunction).  (Patient not taking: Reported on 01/16/2024)   No facility-administered medications prior to visit.    Review of Systems  Constitutional:  Negative for chills and fever.  Respiratory:  Negative for shortness of breath.   Cardiovascular:  Negative for chest pain.  Gastrointestinal:  Negative for abdominal pain.  Genitourinary:  Negative for dysuria.  Neurological:  Negative for dizziness and headaches.       Objective:    BP 133/74   Pulse 67   Ht 5' 6 (1.676 m)   Wt 215 lb (97.5 kg)   SpO2 95%   BMI 34.70 kg/m  BP Readings from Last 3 Encounters:  01/16/24 133/74  11/12/23 132/71  10/30/22 129/65  Physical Exam Vitals reviewed.  Constitutional:      General: He is not in acute distress.    Appearance: Normal appearance. He is not ill-appearing, toxic-appearing or diaphoretic.  HENT:     Head: Normocephalic.     Right Ear: Tympanic membrane normal.     Left Ear: Tympanic membrane normal.     Mouth/Throat:     Mouth: Mucous membranes are moist.  Eyes:     General:        Right eye: No discharge.        Left eye: No discharge.     Conjunctiva/sclera: Conjunctivae normal.     Pupils: Pupils are equal, round, and reactive to light.  Cardiovascular:     Rate and Rhythm: Normal rate.      Pulses: Normal pulses.     Heart sounds: Normal heart sounds.  Pulmonary:     Effort: Pulmonary effort is normal. No respiratory distress.     Breath sounds: Normal breath sounds.  Abdominal:     General: Bowel sounds are normal.     Palpations: Abdomen is soft.     Tenderness: There is no abdominal tenderness. There is no right CVA tenderness, left CVA tenderness or guarding.  Musculoskeletal:        General: Normal range of motion.     Cervical back: Normal range of motion.  Skin:    General: Skin is warm and dry.     Capillary Refill: Capillary refill takes less than 2 seconds.  Neurological:     Mental Status: He is alert.     Coordination: Coordination normal.     Gait: Gait normal.  Psychiatric:        Mood and Affect: Mood normal.        Behavior: Behavior normal.      No results found for any visits on 01/16/24.    Assessment & Plan:    Routine Health Maintenance and Physical Exam  Immunization History  Administered Date(s) Administered   INFLUENZA, HIGH DOSE SEASONAL PF 01/16/2024   PFIZER(Purple Top)SARS-COV-2 Vaccination 07/03/2019, 07/28/2019   Pneumococcal Polysaccharide-23 07/12/2020    Health Maintenance  Topic Date Due   Hepatitis C Screening  Never done   DTaP/Tdap/Td (1 - Tdap) Never done   Zoster Vaccines- Shingrix (1 of 2) Never done   Pneumococcal Vaccine: 50+ Years (2 of 2 - PCV) 07/12/2021   COVID-19 Vaccine (3 - 2025-26 season) 01/06/2024   Medicare Annual Wellness (AWV)  09/01/2024   Colonoscopy  08/15/2031   Influenza Vaccine  Completed   HPV VACCINES  Aged Out   Meningococcal B Vaccine  Aged Out    Discussed health benefits of physical activity, and encouraged him to engage in regular exercise appropriate for his age and condition.  Primary hypertension -     Lipid panel -     CMP14+EGFR -     CBC with Differential/Platelet  TSH (thyroid-stimulating hormone deficiency) -     TSH + free T4  IFG (impaired fasting glucose) -      Hemoglobin A1c  Vitamin D  deficiency -     VITAMIN D  25 Hydroxy (Vit-D Deficiency, Fractures)  Need for hepatitis C screening test -     Hepatitis C antibody  Encounter for immunization -     Flu vaccine HIGH DOSE PF(Fluzone Trivalent)  Encounter for routine adult physical exam with abnormal findings Assessment & Plan: A comprehensive physical examination was completed, and necessary labs were ordered. Screening and health  maintenance recommendations have been updated. The patient received counseling on exercise and nutrition. BMI was assessed and discussed Advise for heart health, focus on: Eat more fruits and vegetables: Aim for a variety of colors. Choose whole grains: Brown rice, oats, and whole-wheat bread. Limit unhealthy fats: Avoid trans fats; use olive or avocado oil instead. Include lean proteins: Opt for fish, chicken, beans, and legumes. Reduce sodium: Limit processed foods and add less salt. Stay hydrated: Drink plenty of water . Exercise regularly: Aim for at least 30 minutes of moderate exercise, like walking or cycling, 5 days a week.       Return in about 4 months (around 05/17/2024), or if symptoms worsen or fail to improve, for chronic follow-up.     Hilario Kidd Wilhelmena Falter, FNP

## 2024-01-17 ENCOUNTER — Ambulatory Visit: Payer: Self-pay | Admitting: Family Medicine

## 2024-01-17 ENCOUNTER — Other Ambulatory Visit: Payer: Self-pay | Admitting: Family Medicine

## 2024-01-17 LAB — CBC WITH DIFFERENTIAL/PLATELET
Basophils Absolute: 0 x10E3/uL (ref 0.0–0.2)
Basos: 0 %
EOS (ABSOLUTE): 0.1 x10E3/uL (ref 0.0–0.4)
Eos: 2 %
Hematocrit: 42.2 % (ref 37.5–51.0)
Hemoglobin: 13.5 g/dL (ref 13.0–17.7)
Immature Grans (Abs): 0 x10E3/uL (ref 0.0–0.1)
Immature Granulocytes: 0 %
Lymphocytes Absolute: 2.4 x10E3/uL (ref 0.7–3.1)
Lymphs: 33 %
MCH: 29 pg (ref 26.6–33.0)
MCHC: 32 g/dL (ref 31.5–35.7)
MCV: 91 fL (ref 79–97)
Monocytes Absolute: 1.1 x10E3/uL — ABNORMAL HIGH (ref 0.1–0.9)
Monocytes: 15 %
Neutrophils Absolute: 3.6 x10E3/uL (ref 1.4–7.0)
Neutrophils: 50 %
Platelets: 213 x10E3/uL (ref 150–450)
RBC: 4.65 x10E6/uL (ref 4.14–5.80)
RDW: 15.2 % (ref 11.6–15.4)
WBC: 7.3 x10E3/uL (ref 3.4–10.8)

## 2024-01-17 LAB — CMP14+EGFR
ALT: 21 IU/L (ref 0–44)
AST: 28 IU/L (ref 0–40)
Albumin: 4.6 g/dL (ref 3.8–4.8)
Alkaline Phosphatase: 71 IU/L (ref 44–121)
BUN/Creatinine Ratio: 14 (ref 10–24)
BUN: 26 mg/dL (ref 8–27)
Bilirubin Total: 0.4 mg/dL (ref 0.0–1.2)
CO2: 23 mmol/L (ref 20–29)
Calcium: 9.8 mg/dL (ref 8.6–10.2)
Chloride: 102 mmol/L (ref 96–106)
Creatinine, Ser: 1.86 mg/dL — ABNORMAL HIGH (ref 0.76–1.27)
Globulin, Total: 2.6 g/dL (ref 1.5–4.5)
Glucose: 107 mg/dL — ABNORMAL HIGH (ref 70–99)
Potassium: 4.2 mmol/L (ref 3.5–5.2)
Sodium: 140 mmol/L (ref 134–144)
Total Protein: 7.2 g/dL (ref 6.0–8.5)
eGFR: 38 mL/min/1.73 — ABNORMAL LOW (ref >59)

## 2024-01-17 LAB — HEPATITIS C ANTIBODY: Hep C Virus Ab: NONREACTIVE

## 2024-01-17 LAB — VITAMIN D 25 HYDROXY (VIT D DEFICIENCY, FRACTURES): Vit D, 25-Hydroxy: 56.5 ng/mL (ref 30.0–100.0)

## 2024-01-17 LAB — LIPID PANEL
Chol/HDL Ratio: 2.5 ratio (ref 0.0–5.0)
Cholesterol, Total: 223 mg/dL — ABNORMAL HIGH (ref 100–199)
HDL: 88 mg/dL (ref >39)
LDL Chol Calc (NIH): 125 mg/dL — ABNORMAL HIGH (ref 0–99)
Triglycerides: 59 mg/dL (ref 0–149)
VLDL Cholesterol Cal: 10 mg/dL (ref 5–40)

## 2024-01-17 LAB — HEMOGLOBIN A1C
Est. average glucose Bld gHb Est-mCnc: 128 mg/dL
Hgb A1c MFr Bld: 6.1 % — ABNORMAL HIGH (ref 4.8–5.6)

## 2024-01-17 LAB — TSH+FREE T4
Free T4: 1.34 ng/dL (ref 0.82–1.77)
TSH: 0.836 u[IU]/mL (ref 0.450–4.500)

## 2024-01-17 MED ORDER — ROSUVASTATIN CALCIUM 20 MG PO TABS: 20.0000 mg | ORAL_TABLET | Freq: Every day | ORAL | 3 refills | Status: DC

## 2024-03-12 ENCOUNTER — Other Ambulatory Visit: Payer: Self-pay | Admitting: Family Medicine

## 2024-03-12 NOTE — Telephone Encounter (Unsigned)
 Copied from CRM #[id number]. Topic: Clinical - Medication Refill >> Mar 12, 2024  5:16 PM Kevin Rose wrote: Medication: losartan (COZAAR) 25 MG tablet  Has the patient contacted their pharmacy? Yes (Agent: If no, request that the patient contact the pharmacy for the refill. If patient does not wish to contact the pharmacy document the reason why and proceed with request.) (Agent: If yes, when and what did the pharmacy advise?)  This is the patient's preferred pharmacy:  [facility] - [place], [place] - [address] [place] [address] Phone: [phone] Fax: [phone]  Is this the correct pharmacy for this prescription? Yes If no, delete pharmacy and type the correct one.   Has the prescription been filled recently? No  Is the patient out of the medication? No  Has the patient been seen for an appointment in the last year OR does the patient have an upcoming appointment? Yes  Can we respond through MyChart? No  Agent: Please be advised that Rx refills may take up to 3 business days. We ask that you follow-up with your pharmacy.

## 2024-03-13 MED ORDER — LOSARTAN POTASSIUM 25 MG PO TABS: 25.0000 mg | ORAL_TABLET | Freq: Every day | ORAL | 0 refills | Status: DC

## 2024-03-16 ENCOUNTER — Encounter: Payer: Self-pay | Admitting: Podiatry

## 2024-03-16 ENCOUNTER — Ambulatory Visit: Admitting: Podiatry

## 2024-03-16 DIAGNOSIS — M79675 Pain in left toe(s): Secondary | ICD-10-CM

## 2024-03-16 DIAGNOSIS — B351 Tinea unguium: Secondary | ICD-10-CM

## 2024-03-16 DIAGNOSIS — M79674 Pain in right toe(s): Secondary | ICD-10-CM

## 2024-03-16 NOTE — Progress Notes (Signed)
This patient presents to the office with chief complaint of long thick painful nails.  Patient says the nails are painful walking and wearing shoes.  This patient is unable to self treat.  This patient is unable to trim his  nails since he is unable to reach his nails.  She presents to the office for preventative foot care services.  General Appearance  Alert, conversant and in no acute stress.  Vascular  Dorsalis pedis and posterior tibial  pulses are  weakly palpable  bilaterally.  Capillary return is within normal limits  bilaterally. Temperature is within normal limits  bilaterally.  Neurologic  Senn-Weinstein monofilament wire test within normal limits  bilaterally. Muscle power within normal limits bilaterally.  Nails Thick disfigured discolored nails with subungual debris  from hallux to fifth toes bilaterally. No evidence of bacterial infection or drainage bilaterally.  Orthopedic  No limitations of motion  feet .  No crepitus or effusions noted.  No bony pathology or digital deformities noted.  Skin  normotropic skin with no porokeratosis noted bilaterally.  No signs of infections or ulcers noted.     Onychomycosis  Nails  B/L.  Pain in right toes  Pain in left toes  Debridement of nails both feet followed trimming the nails with dremel tool.    RTC 3 months.   Helane Gunther DPM

## 2024-04-06 ENCOUNTER — Other Ambulatory Visit: Payer: Self-pay | Admitting: Family Medicine

## 2024-04-07 ENCOUNTER — Other Ambulatory Visit: Payer: Self-pay | Admitting: Family Medicine

## 2024-04-07 ENCOUNTER — Other Ambulatory Visit: Payer: Self-pay

## 2024-04-07 MED ORDER — ROSUVASTATIN CALCIUM 20 MG PO TABS: 20.0000 mg | ORAL_TABLET | Freq: Every day | ORAL | 3 refills | Status: AC

## 2024-04-22 ENCOUNTER — Ambulatory Visit: Payer: Self-pay | Admitting: Nurse Practitioner

## 2024-05-11 ENCOUNTER — Ambulatory Visit: Payer: Self-pay

## 2024-05-11 ENCOUNTER — Ambulatory Visit
Admission: EM | Admit: 2024-05-11 | Discharge: 2024-05-11 | Disposition: A | Discharge status: Home / Self Care | Attending: Family Medicine | Admitting: Family Medicine

## 2024-05-11 DIAGNOSIS — N390 Urinary tract infection, site not specified: Secondary | ICD-10-CM | POA: Diagnosis not present

## 2024-05-11 LAB — POCT URINE DIPSTICK
Bilirubin, UA: NEGATIVE
Glucose, UA: NEGATIVE mg/dL
Ketones, POC UA: NEGATIVE mg/dL
Nitrite, UA: NEGATIVE
POC PROTEIN,UA: 30 — AB
Spec Grav, UA: 1.01
Urobilinogen, UA: 4 U/dL — AB
pH, UA: 6

## 2024-05-11 MED ORDER — SULFAMETHOXAZOLE-TRIMETHOPRIM 800-160 MG PO TABS: 1.0000 | ORAL_TABLET | Freq: Two times a day (BID) | ORAL | 0 refills | Status: AC

## 2024-05-11 NOTE — ED Triage Notes (Signed)
 Urinary frequency, burning with urination, burning at rectum x 3 days.

## 2024-05-11 NOTE — Discharge Instructions (Addendum)
 We will let you know if we need to make any changes based on your urine culture results.  Take the full course of antibiotics unless told otherwise and drink plenty of fluids.  Follow-up for worsening or unresolving symptoms.

## 2024-05-11 NOTE — Telephone Encounter (Signed)
 FYI Only or Action Required?: FYI only for provider: UC advised .  Patient was last seen in primary care on 01/16/2024 by Terry Wilhelmena Lloyd Hilario, FNP.  Called Nurse Triage reporting Urinary Frequency and Fatigue.  Symptoms began several days ago.  Interventions attempted: Rest, hydration, or home remedies.  Symptoms are: stable.  Triage Disposition: See HCP Within 4 Hours (Or PCP Triage)  Patient/caregiver understands and will follow disposition?: Yes        Reason for Disposition  Fever > 100.4 F (38.0 C)  Answer Assessment - Initial Assessment Questions Patient spouse Lauraine (DPR) extreme fatigue last few days loss of appetite and increased urination  frequency. Drinking lots of water  producing urine. Eating some. Is concerned is about prostate had had PSA was a little high . Discomfort with urination pain level is . Able to stand and walk. Had problems with prostate before had biopsy in past has follow up in July. No same day visit advised urgent care spouse agreeable and taking him to UC now.     1. SEVERITY: How bad is the pain?  (e.g., Scale 1-10; mild, moderate, or severe)     4-5/10  2. FREQUENCY: How many times have you had painful urination today?      Up and down all night per spouse . 4-5 times  3. PATTERN: Is pain present every time you urinate or just sometimes?      Intermittent  4. ONSET: When did the painful urination start?      Few days ago 5. FEVER: Do you have a fever? If Yes, ask: What is your temperature, how was it measured, and when did it start?     Unsure  possible, not able to assess chills yesterday but not tonight or this morning  , yesterday was uncontrollable  6. PAST UTI: Have you had a urine infection before? If Yes, ask: When was the last time? and What happened that time?      Unsure if was UTI but had something to do with prostate being enlarged.  7. CAUSE: What do you think is causing the painful urination?       Unknown  8. OTHER SYMPTOMS: Do you have any other symptoms? (e.g., flank pain, penis discharge, scrotal pain, blood in urine) Spouse reports patient has extreme fatigue less appetite drinking a lot of water  urination increased and burns sometimes when pees possible fever  yesterday      Patient spouse denies the following chest pain , shortness of breath, dizziness, syncope, blood in urine, back pain ,abodomionl pain, swollen scrotum, painful scrotum, inability to pass urine  Protocols used: Urination Pain - Male-A-AH Copied from CRM #[id number]. Topic: Clinical - Red Word Triage >> May 11, 2024  8:07 AM Ivette P wrote: Red Word that prompted transfer to Nurse Triage: pt having issues - with fatiuge and frequent urination also something with prostate, having chills. believes has an infection.

## 2024-05-11 NOTE — ED Provider Notes (Signed)
 " [facility]    CSN: [id number] Arrival date & time: 05/11/24  9078      History   Chief Complaint Chief Complaint  Patient presents with   Urinary Frequency    HPI ADELAIDO NICKLAUS is a 74 y.o. male.   Patient presenting today with 3-day history of urinary frequency, dysuria, rectal pressure.  Denies fever, chills, hematuria, bowel changes, abdominal pain.  So far not trying anything over-the-counter for symptoms.    Past Medical History:  Diagnosis Date   Hypertension    Medical history non-contributory    Sleep apnea     Patient Active Problem List   Diagnosis Date Noted   Encounter for routine adult physical exam with abnormal findings 01/16/2024   Hemorrhoids 10/28/2013   Family history of colonic polyps 10/28/2013    Past Surgical History:  Procedure Laterality Date   COLONOSCOPY  07/2003   friable anal canal.   COLONOSCOPY N/A 11/19/2013   Procedure: COLONOSCOPY;  Surgeon: Lamar CHRISTELLA Hollingshead, MD;  Location: [facility];  Service: Endoscopy;  Laterality: N/A;  10:30-rescheduled to 1245 Leigh Ann notified pt   COLONOSCOPY WITH PROPOFOL  N/A 08/14/2021   Procedure: COLONOSCOPY WITH PROPOFOL ;  Surgeon: Hollingshead Lamar CHRISTELLA, MD;  Location: [facility];  Service: Endoscopy;  Laterality: N/A;  8:30 / ASA 2   HEMOSTASIS CLIP PLACEMENT  08/14/2021   Procedure: HEMOSTASIS CLIP PLACEMENT;  Surgeon: Hollingshead Lamar CHRISTELLA, MD;  Location: [facility];  Service: Endoscopy;;   POLYPECTOMY  08/14/2021   Procedure: POLYPECTOMY;  Surgeon: Hollingshead Lamar CHRISTELLA, MD;  Location: [facility];  Service: Endoscopy;;   SHOULDER ARTHROSCOPY WITH OPEN ROTATOR CUFF REPAIR Right        Home Medications    Prior to Admission medications  Medication Sig Start Date End Date Taking? Authorizing Provider  amLODipine  (NORVASC ) 5 MG tablet TAKE 1 TABLET EVERY DAY 04/06/24  Yes Del Orbe Polanco, Ewa Gentry, FNP  chlorthalidone  (HYGROTON ) 25 MG tablet Take 0.5 tablets (12.5 mg total) by mouth every  morning. 01/16/24  Yes Del Orbe Polanco, Hilario, FNP  Cholecalciferol (VITAMIN D3 PO) Take 1 tablet by mouth daily.   Yes [provider]  Flaxseed, Linseed, (FLAX SEED OIL) 1300 MG CAPS Take 1,300 mg by mouth in the morning and at bedtime.   Yes [provider]  latanoprost  (XALATAN ) 0.005 % ophthalmic solution Place 1 drop into both eyes at bedtime. 01/16/24  Yes Del Orbe Polanco, Hilario, FNP  losartan  (COZAAR ) 25 MG tablet Take 1 tablet (25 mg total) by mouth daily. 03/13/24  Yes Del Orbe Polanco, Hilario, FNP  Multiple Vitamin (MULTIVITAMIN WITH MINERALS) TABS tablet Take 1 tablet by mouth daily.   Yes [provider]  rosuvastatin  (CRESTOR ) 20 MG tablet Take 1 tablet (20 mg total) by mouth daily. 04/07/24  Yes Del Orbe Polanco, Hilario, FNP  sildenafil  (VIAGRA ) 100 MG tablet 1/2-1 tab po prn 11/12/23  Yes Dahlstedt, Garnette, MD  sulfamethoxazole -trimethoprim  (BACTRIM  DS) 800-160 MG tablet Take 1 tablet by mouth 2 (two) times daily for 7 days. 05/11/24 05/18/24 Yes Stuart Vernell Norris, PA-C    Family History Family History  Problem Relation Age of Onset   Colon polyps Mother    Prostate cancer Father    Lung cancer Brother    Colon cancer Neg Hx     Social History Social History[1]   Allergies   Patient has no known allergies.   Review of Systems Review of Systems PER HPI  Physical  Exam Triage Vital Signs ED Triage Vitals [05/11/24 1037]  Encounter Vitals Group     BP 125/71     Girls Systolic BP Percentile      Girls Diastolic BP Percentile      Boys Systolic BP Percentile      Boys Diastolic BP Percentile      Pulse Rate 83     Resp 16     Temp 99.2 F (37.3 C)     Temp Source Oral     SpO2 95 %     Weight      Height      Head Circumference      Peak Flow      Pain Score 4     Pain Loc      Pain Education      Exclude from Growth Chart    No data found.  Updated Vital Signs BP 125/71 (BP Location: Right Arm)   Pulse 83   Temp 99.2  F (37.3 C) (Oral)   Resp 16   SpO2 95%   Visual Acuity Right Eye Distance:   Left Eye Distance:   Bilateral Distance:    Right Eye Near:   Left Eye Near:    Bilateral Near:     Physical Exam Vitals and nursing note reviewed.  Constitutional:      Appearance: Normal appearance.  HENT:     Head: Atraumatic.  Eyes:     Extraocular Movements: Extraocular movements intact.     Conjunctiva/sclera: Conjunctivae normal.  Cardiovascular:     Rate and Rhythm: Normal rate.  Pulmonary:     Effort: Pulmonary effort is normal.  Abdominal:     General: Bowel sounds are normal. There is no distension.     Palpations: Abdomen is soft.     Tenderness: There is no abdominal tenderness. There is no right CVA tenderness, left CVA tenderness or guarding.  Musculoskeletal:        General: Normal range of motion.     Cervical back: Normal range of motion and neck supple.  Skin:    General: Skin is warm and dry.  Neurological:     Mental Status: He is oriented to person, place, and time.  Psychiatric:        Mood and Affect: Mood normal.        Thought Content: Thought content normal.        Judgment: Judgment normal.      UC Treatments / Results  Labs (all labs ordered are listed, but only abnormal results are displayed) Labs Reviewed  POCT URINE DIPSTICK - Abnormal; Notable for the following components:      Result Value   Blood, UA small (*)    POC PROTEIN,UA =30 (*)    Urobilinogen, UA 4.0 (*)    Leukocytes, UA Small (1+) (*)    All other components within normal limits  URINE CULTURE    EKG   Radiology No results found.  Procedures Procedures (including critical care time)  Medications Ordered in UC Medications - No data to display  Initial Impression / Assessment and Plan / UC Course  I have reviewed the triage vital signs and the nursing notes.  Pertinent labs & imaging results that were available during my care of the patient were reviewed by me and  considered in my medical decision making (see chart for details).     Vitals and exam reassuring today, urinalysis with evidence of a urinary tract infection.  Treat  with Bactrim , await urine culture and adjust if needed.  Return for worsening or unresolving symptoms.  Final Clinical Impressions(s) / UC Diagnoses   Final diagnoses:  Acute lower UTI     Discharge Instructions      We will let you know if we need to make any changes based on your urine culture results.  Take the full course of antibiotics unless told otherwise and drink plenty of fluids.  Follow-up for worsening or unresolving symptoms.    ED Prescriptions     Medication Sig Dispense Auth. Provider   sulfamethoxazole -trimethoprim  (BACTRIM  DS) 800-160 MG tablet Take 1 tablet by mouth 2 (two) times daily for 7 days. 14 tablet Stuart Vernell Norris, [place]      PDMP not reviewed this encounter.    [1]  Social History Tobacco Use   Smoking status: Never   Smokeless tobacco: Never  Vaping Use   Vaping status: Never Used  Substance Use Topics   Alcohol use: No   Drug use: No     Stuart Vernell Norris, PA-C 05/11/24 1136  "

## 2024-05-13 ENCOUNTER — Ambulatory Visit: Payer: Self-pay

## 2024-05-13 LAB — URINE CULTURE: Culture: >=100000 — AB

## 2024-05-13 NOTE — Telephone Encounter (Signed)
 noted

## 2024-05-19 ENCOUNTER — Ambulatory Visit: Admitting: Family Medicine

## 2024-05-25 ENCOUNTER — Encounter: Payer: Self-pay | Admitting: Podiatry

## 2024-05-25 ENCOUNTER — Ambulatory Visit: Admitting: Podiatry

## 2024-05-25 DIAGNOSIS — M79674 Pain in right toe(s): Secondary | ICD-10-CM | POA: Diagnosis not present

## 2024-05-25 DIAGNOSIS — M79675 Pain in left toe(s): Secondary | ICD-10-CM | POA: Diagnosis not present

## 2024-05-25 DIAGNOSIS — B351 Tinea unguium: Secondary | ICD-10-CM | POA: Diagnosis not present

## 2024-05-25 NOTE — Progress Notes (Signed)
This patient presents to the office with chief complaint of long thick painful nails.  Patient says the nails are painful walking and wearing shoes.  This patient is unable to self treat.  This patient is unable to trim his  nails since he is unable to reach his nails.  She presents to the office for preventative foot care services.  General Appearance  Alert, conversant and in no acute stress.  Vascular  Dorsalis pedis and posterior tibial  pulses are  weakly palpable  bilaterally.  Capillary return is within normal limits  bilaterally. Temperature is within normal limits  bilaterally.  Neurologic  Senn-Weinstein monofilament wire test within normal limits  bilaterally. Muscle power within normal limits bilaterally.  Nails Thick disfigured discolored nails with subungual debris  from hallux to fifth toes bilaterally. No evidence of bacterial infection or drainage bilaterally.  Orthopedic  No limitations of motion  feet .  No crepitus or effusions noted.  No bony pathology or digital deformities noted.  Skin  normotropic skin with no porokeratosis noted bilaterally.  No signs of infections or ulcers noted.     Onychomycosis  Nails  B/L.  Pain in right toes  Pain in left toes  Debridement of nails both feet followed trimming the nails with dremel tool.    RTC 3 months.   Helane Gunther DPM

## 2024-05-29 ENCOUNTER — Other Ambulatory Visit: Payer: Self-pay | Admitting: Family Medicine

## 2024-06-08 ENCOUNTER — Other Ambulatory Visit: Payer: Self-pay | Admitting: Family Medicine

## 2024-07-27 ENCOUNTER — Ambulatory Visit: Payer: Self-pay

## 2024-08-24 ENCOUNTER — Ambulatory Visit: Admitting: Podiatry

## 2024-11-10 ENCOUNTER — Other Ambulatory Visit

## 2024-11-17 ENCOUNTER — Ambulatory Visit: Admitting: Urology
